# Patient Record
Sex: Male | Born: 1985 | Race: White | Hispanic: Yes | Marital: Married | Smoking: Former smoker
Health system: Southern US, Community
[De-identification: ages and names within clinical notes are randomized; demographics above are authoritative.]

## PROBLEM LIST (undated history)

## (undated) DIAGNOSIS — K219 Gastro-esophageal reflux disease without esophagitis: Secondary | ICD-10-CM

## (undated) DIAGNOSIS — L719 Rosacea, unspecified: Secondary | ICD-10-CM

## (undated) DIAGNOSIS — K589 Irritable bowel syndrome without diarrhea: Secondary | ICD-10-CM

## (undated) DIAGNOSIS — E669 Obesity, unspecified: Secondary | ICD-10-CM

## (undated) DIAGNOSIS — K760 Fatty (change of) liver, not elsewhere classified: Secondary | ICD-10-CM

## (undated) HISTORY — DX: Rosacea, unspecified: L71.9

## (undated) HISTORY — DX: Irritable bowel syndrome, unspecified: K58.9

## (undated) HISTORY — DX: Gastro-esophageal reflux disease without esophagitis: K21.9

## (undated) HISTORY — DX: Fatty (change of) liver, not elsewhere classified: K76.0

## (undated) HISTORY — DX: Obesity, unspecified: E66.9

---

## 2014-11-29 ENCOUNTER — Ambulatory Visit (INDEPENDENT_AMBULATORY_CARE_PROVIDER_SITE_OTHER): Payer: PRIVATE HEALTH INSURANCE | Admitting: Family Medicine

## 2014-11-29 ENCOUNTER — Encounter: Payer: Self-pay | Admitting: Family Medicine

## 2014-11-29 VITALS — BP 151/100 | HR 99 | Temp 98.3°F | Ht 70.0 in | Wt 248.0 lb

## 2014-11-29 DIAGNOSIS — Z0000 Encounter for general adult medical examination without abnormal findings: Secondary | ICD-10-CM

## 2014-11-29 DIAGNOSIS — Z309 Encounter for contraceptive management, unspecified: Secondary | ICD-10-CM

## 2014-11-29 DIAGNOSIS — K219 Gastro-esophageal reflux disease without esophagitis: Secondary | ICD-10-CM | POA: Insufficient documentation

## 2014-11-29 DIAGNOSIS — I10 Essential (primary) hypertension: Secondary | ICD-10-CM

## 2014-11-29 DIAGNOSIS — Z3009 Encounter for other general counseling and advice on contraception: Secondary | ICD-10-CM

## 2014-11-29 MED ORDER — OMEPRAZOLE 40 MG PO CPDR: DELAYED_RELEASE_CAPSULE | ORAL | Status: DC

## 2014-11-29 NOTE — Patient Instructions (Signed)
Dr. Lajoyce Lauber General Advice Following Your Complete Physical Exam  The Benefits of Regular Exercise: Unless you suffer from an uncontrolled cardiovascular condition, studies strongly suggest that regular exercise and physical activity will add to both the quality and length of your life.  The World Health Organization recommends 150 minutes of moderate intensity aerobic activity every week.  This is best split over 3-4 days a week, and can be as simple as a brisk walk for just over 35 minutes "most days of the week".  This type of exercise has been shown to lower LDL-Cholesterol, lower average blood sugars, lower blood pressure, lower cardiovascular disease risk, improve memory, and increase one's overall sense of wellbeing.  The addition of anaerobic (or "strength training") exercises offers additional benefits including but not limited to increased metabolism, prevention of osteoporosis, and improved overall cholesterol levels.  How Can I Strive For A Low-Fat Diet?: Current guidelines recommend that 25-35 percent of your daily energy (food) intake should come from fats.  One might ask how can this be achieved without having to dissect each meal on a daily basis?  Switch to skim or 1% milk instead of whole milk.  Focus on lean meats such as ground [place], fresh fish, baked chicken, and lean cuts of beef as your source of dietary protein.  Limit saturated fat consumption to less than 10% of your daily caloric intake.  Limit trans fatty acid consumption primarily by limiting synthetic trans fats such as partially hydrogenated oils (Ex: fried fast foods).  Substitute olive or vegetable oil for solid fats where possible.  Moderation of Salt Intake: Provided you don't carry a diagnosis of congestive heart failure nor renal failure, I recommend a daily allowance of no more than 2000 mg of salt (sodium).  Keeping under this daily goal is associated with a decreased risk of cardiovascular events, creeping  above it can lead to elevated blood pressures and increases your risk of cardiovascular events.  Milligrams (mg) of salt is listed on all nutrition labels, and your daily intake can add up faster than you think.  Most canned and frozen dinners can pack in over half your daily salt allowance in one meal.    Lifestyle Health Risks: Certain lifestyle choices carry specific health risks.  As you may already know, tobacco use has been associated with increasing one's risk of cardiovascular disease, pulmonary disease, numerous cancers, among many other issues.  What you may not know is that there are medications and nicotine replacement strategies that can more than double your chances of successfully quitting.  I would be thrilled to help manage your quitting strategy if you currently use tobacco products.  When it comes to alcohol use, I've yet to find an "ideal" daily allowance.  Provided an individual does not have a medical condition that is exacerbated by alcohol consumption, general guidelines determine "safe drinking" as no more than two standard drinks for a man or no more than one standard drink for a male per day.  However, much debate still exists on whether any amount of alcohol consumption is technically "safe".  My general advice, keep alcohol consumption to a minimum for general health promotion.  If you or others believe that alcohol, tobacco, or recreational drug use is interfering with your life, I would be happy to provide confidential counseling regarding treatment options.  General "Over The Counter" Nutrition Advice: Postmenopausal women should aim for a daily calcium intake of 1200 mg, however a significant portion of this might already be  provided by diets including milk, yogurt, cheese, and other dairy products.  Vitamin D has been shown to help preserve bone density, prevent fatigue, and has even been shown to help reduce falls in the elderly.  Ensuring a daily intake of 800 Units of  Vitamin D is a good place to start to enjoy the above benefits, we can easily check your Vitamin D level to see if you'd potentially benefit from supplementation beyond 800 Units a day.  Folic Acid intake should be of particular concern to women of childbearing age.  Daily consumption of 400-800 mcg of Folic Acid is recommended to minimize the chance of spinal cord defects in a fetus should pregnancy occur.    For many adults, accidents still remain one of the most common culprits when it comes to cause of death.  Some of the simplest but most effective preventitive habits you can adopt include regular seatbelt use, proper helmet use, securing firearms, and regularly testing your smoke and carbon monoxide detectors.  Bradley Diaz B. Bradley Sevillano DO [facility] [address], [address] [place], [place] [address] Phone: [phone]  

## 2014-11-29 NOTE — Progress Notes (Signed)
CC: Bradley Diaz is a 28 y.o. male is here for Establish Care and URI   Subjective: HPI:  Very pleasant 28 year old here to establish care requesting complete physical exam  Rare alcohol use no tobacco or recreational drug use  He reports a history of epigastric discomfort with burning that radiates up behind the sternum after eating large meals or spicy acidic foods. Symptoms are alleviated he takes 20 mg of omeprazole on a daily basis. Over the past year he has frequently try to take a holiday from this above regimen and has return of symptoms within 1 or 2 days. Currently he denies any symptoms since he's been taking this regularly for at least a month now.  He wants to know if he can be referred to a urologist for vasectomy evaluation.  Review of Systems - General ROS: negative for - chills, fever, night sweats, weight gain or weight loss Ophthalmic ROS: negative for - decreased vision Psychological ROS: negative for - anxiety or depression ENT ROS: negative for - hearing change, nasal congestion, tinnitus or allergies Hematological and Lymphatic ROS: negative for - bleeding problems, bruising or swollen lymph nodes Breast ROS: negative Respiratory ROS: no cough, shortness of breath, or wheezing Cardiovascular ROS: no chest pain or dyspnea on exertion Gastrointestinal ROS: no current abdominal pain, change in bowel habits, or black or bloody stools Genito-Urinary ROS: negative for - genital discharge, genital ulcers, incontinence or abnormal bleeding from genitals Musculoskeletal ROS: negative for - joint pain or muscle pain Neurological ROS: negative for - headaches or memory loss Dermatological ROS: negative for lumps, mole changes, rash and skin lesion changes  Past Medical History  Diagnosis Date  . Acid reflux     No past surgical history on file. Family History  Problem Relation Age of Onset  . Heart attack Father   . Diabetes Father   . Lupus Mother     History    Social History  . Marital Status: Married    Spouse Name: N/A    Number of Children: N/A  . Years of Education: N/A   Occupational History  . Not on file.   Social History Main Topics  . Smoking status: Former Smoker    Quit date: 12/30/2013  . Smokeless tobacco: Not on file  . Alcohol Use: 0.0 oz/week    0 Not specified per week  . Drug Use: No  . Sexual Activity:    Partners: Female   Other Topics Concern  . Not on file   Social History Narrative  . No narrative on file     Objective: BP 151/100 mmHg  Pulse 99  Temp(Src) 98.3 F (36.8 C) (Oral)  Ht 5\' 10"  (1.778 m)  Wt 248 lb (112.492 kg)  BMI 35.58 kg/m2  General: No Acute Distress HEENT: Atraumatic, normocephalic, conjunctivae normal without scleral icterus.  No nasal discharge, hearing grossly intact, TMs with good landmarks bilaterally with no middle ear abnormalities, posterior pharynx clear without oral lesions. Neck: Supple, trachea midline, no cervical nor supraclavicular adenopathy. Pulmonary: Clear to auscultation bilaterally without wheezing, rhonchi, nor rales. Cardiac: Regular rate and rhythm.  No murmurs, rubs, nor gallops. No peripheral edema.  2+ peripheral pulses bilaterally. Abdomen: Bowel sounds normal.  No masses.  Non-tender without rebound.  Negative Murphy's sign. MSK: Grossly intact, no signs of weakness.  Full strength throughout upper and lower extremities.  Full ROM in upper and lower extremities.  No midline spinal tenderness. Neuro: Gait unremarkable, CN II-XII grossly intact.  C5-C6  Reflex 2/4 Bilaterally, L4 Reflex 2/4 Bilaterally.  Cerebellar function intact. Skin: No rashes. Psych: Alert and oriented to person/place/time.  Thought process normal. No anxiety/depression.  Assessment & Plan: Ruger was seen today for establish care and uri.  Diagnoses and associated orders for this visit:  Annual physical exam - COMPLETE METABOLIC PANEL WITH GFR - TSH - Lipid  panel  Essential hypertension - TSH  Gastroesophageal reflux disease, esophagitis presence not specified - omeprazole (PRILOSEC) 40 MG capsule; One by mouth daily at least one hour before a meal.  Vasectomy evaluation - Ambulatory referral to Urology    Healthy lifestyle interventions including but not limited to regular exercise, a healthy low fat diet, moderation of salt intake, the dangers of tobacco/alcohol/recreational drug use, nutrition supplementation, and accident avoidance were discussed with the patient and a handout was provided for future reference. Essential hypertension: After reviewing his diet sounds like he has a huge room for improvement with reducing sodium artifact found in his processed and fast foods. Discussed reducing sodium intake to less than 2000 mg on a daily basis and returning in one month to determine if he needs to be on antihypertensives. Rule out hyperthyroidism and renal disease. GERD: Controlled on omeprazole, provided with formal prescription  Return in about 4 weeks (around 12/27/2014) for Blood pressure recheck.

## 2014-12-05 ENCOUNTER — Ambulatory Visit: Payer: Self-pay | Admitting: Family Medicine

## 2014-12-13 ENCOUNTER — Telehealth: Payer: Self-pay | Admitting: *Deleted

## 2014-12-13 ENCOUNTER — Telehealth: Payer: Self-pay | Admitting: Family Medicine

## 2014-12-13 DIAGNOSIS — R739 Hyperglycemia, unspecified: Secondary | ICD-10-CM

## 2014-12-13 DIAGNOSIS — R7989 Other specified abnormal findings of blood chemistry: Secondary | ICD-10-CM

## 2014-12-13 DIAGNOSIS — R945 Abnormal results of liver function studies: Secondary | ICD-10-CM

## 2014-12-13 LAB — LIPID PANEL
CHOL/HDL RATIO: 5.6 ratio
Cholesterol: 140 mg/dL (ref 0–200)
HDL: 25 mg/dL — ABNORMAL LOW (ref >39)
LDL Cholesterol: 91 mg/dL (ref 0–99)
TRIGLYCERIDES: 118 mg/dL (ref <150)
VLDL: 24 mg/dL (ref 0–40)

## 2014-12-13 LAB — COMPLETE METABOLIC PANEL WITH GFR
ALK PHOS: 108 U/L (ref 39–117)
ALT: 249 U/L — ABNORMAL HIGH (ref 0–53)
AST: 117 U/L — ABNORMAL HIGH (ref 0–37)
Albumin: 4.6 g/dL (ref 3.5–5.2)
BILIRUBIN TOTAL: 1 mg/dL (ref 0.2–1.2)
BUN: 12 mg/dL (ref 6–23)
CO2: 26 mEq/L (ref 19–32)
Calcium: 9.8 mg/dL (ref 8.4–10.5)
Chloride: 99 mEq/L (ref 96–112)
Creat: 0.86 mg/dL (ref 0.50–1.35)
GFR, Est African American: >89 mL/min
GFR, Est Non African American: >89 mL/min
Glucose, Bld: 123 mg/dL — ABNORMAL HIGH (ref 70–99)
Potassium: 4.6 mEq/L (ref 3.5–5.3)
SODIUM: 136 meq/L (ref 135–145)
TOTAL PROTEIN: 8.2 g/dL (ref 6.0–8.3)

## 2014-12-13 LAB — TSH: TSH: 1.208 u[IU]/mL (ref 0.350–4.500)

## 2014-12-13 NOTE — Telephone Encounter (Signed)
Bradley Diaz wanted me to reenter labs into the system to that he could check something. He has orig order and he will cancel the one I just out in

## 2014-12-13 NOTE — Telephone Encounter (Signed)
Pt notified of results.lab order faxed

## 2014-12-13 NOTE — Telephone Encounter (Signed)
Additionally cholesterol and thyroid function were normal.

## 2014-12-13 NOTE — Telephone Encounter (Signed)
Voicemail left for patient to call.

## 2014-12-13 NOTE — Telephone Encounter (Signed)
Andrea/Jamie, Will you please let patient know that his fasting blood sugar was elevated and I'd recommend he have an A1c checked to measure his three month average blood sugar to look into the possibility of having type 2 diabetes.  His liver enzymes were also significantly elevated representing liver inflammation.  I'd like to rule out viral hepatitis with another blood test and to also look into whether fat deposition in his liver is causing this inflammation by getting an ultrasound of the liver.  Please call me if not notified by the radiology department by next week for scheduling (lab slips in your inbox)

## 2014-12-14 LAB — HEPATITIS PANEL, ACUTE
HCV Ab: NEGATIVE
HEP A IGM: NONREACTIVE
Hep B C IgM: NONREACTIVE
Hepatitis B Surface Ag: NEGATIVE

## 2014-12-14 LAB — HEMOGLOBIN A1C
HEMOGLOBIN A1C: 6.2 % — AB (ref <5.7)
Mean Plasma Glucose: 131 mg/dL — ABNORMAL HIGH (ref <117)

## 2014-12-18 ENCOUNTER — Telehealth: Payer: Self-pay | Admitting: Family Medicine

## 2014-12-18 ENCOUNTER — Encounter: Payer: Self-pay | Admitting: Family Medicine

## 2014-12-18 DIAGNOSIS — E1159 Type 2 diabetes mellitus with other circulatory complications: Secondary | ICD-10-CM | POA: Insufficient documentation

## 2014-12-18 DIAGNOSIS — R7303 Prediabetes: Secondary | ICD-10-CM

## 2014-12-18 NOTE — Telephone Encounter (Signed)
Bradley Diaz, Will you please let patient know that his A1c was in the prediabetic range.  It's not high enough to warrant blood sugar lowering medicaton however we'll want to recheck this every 3 months.  This can be improved with engaging in 30-45 minutes of moderate exercise most days of the week and cutting back on carbohydrates in the diet.  I'll let him know once I get results from his ultrasound of the liver, thankfully his viral hepatitis labs were all normal.

## 2014-12-18 NOTE — Telephone Encounter (Signed)
Pt.notified

## 2014-12-20 ENCOUNTER — Ambulatory Visit (INDEPENDENT_AMBULATORY_CARE_PROVIDER_SITE_OTHER): Payer: PRIVATE HEALTH INSURANCE

## 2014-12-20 ENCOUNTER — Encounter: Payer: Self-pay | Admitting: Family Medicine

## 2014-12-20 ENCOUNTER — Other Ambulatory Visit: Payer: Self-pay | Admitting: Family Medicine

## 2014-12-20 DIAGNOSIS — K760 Fatty (change of) liver, not elsewhere classified: Secondary | ICD-10-CM | POA: Insufficient documentation

## 2014-12-20 DIAGNOSIS — R945 Abnormal results of liver function studies: Principal | ICD-10-CM

## 2014-12-20 DIAGNOSIS — R7989 Other specified abnormal findings of blood chemistry: Secondary | ICD-10-CM

## 2014-12-20 MED ORDER — VITAMIN E 180 MG (400 UNIT) PO CAPS: 400.0000 [IU] | ORAL_CAPSULE | Freq: Two times a day (BID) | ORAL | Status: DC

## 2014-12-27 ENCOUNTER — Ambulatory Visit: Payer: PRIVATE HEALTH INSURANCE | Admitting: Family Medicine

## 2015-01-08 ENCOUNTER — Ambulatory Visit (INDEPENDENT_AMBULATORY_CARE_PROVIDER_SITE_OTHER): Payer: PRIVATE HEALTH INSURANCE | Admitting: Family Medicine

## 2015-01-08 ENCOUNTER — Encounter: Payer: Self-pay | Admitting: Family Medicine

## 2015-01-08 VITALS — BP 123/82 | HR 96 | Ht 70.0 in | Wt 237.0 lb

## 2015-01-08 DIAGNOSIS — R7303 Prediabetes: Secondary | ICD-10-CM

## 2015-01-08 DIAGNOSIS — R945 Abnormal results of liver function studies: Secondary | ICD-10-CM

## 2015-01-08 DIAGNOSIS — R7309 Other abnormal glucose: Secondary | ICD-10-CM

## 2015-01-08 DIAGNOSIS — R7989 Other specified abnormal findings of blood chemistry: Secondary | ICD-10-CM

## 2015-01-08 DIAGNOSIS — I10 Essential (primary) hypertension: Secondary | ICD-10-CM

## 2015-01-08 LAB — COMPLETE METABOLIC PANEL WITH GFR
ALBUMIN: 4 g/dL (ref 3.5–5.2)
ALT: 91 U/L — ABNORMAL HIGH (ref 0–53)
AST: 43 U/L — AB (ref 0–37)
Alkaline Phosphatase: 100 U/L (ref 39–117)
BUN: 14 mg/dL (ref 6–23)
CALCIUM: 9.6 mg/dL (ref 8.4–10.5)
CO2: 26 meq/L (ref 19–32)
CREATININE: 0.92 mg/dL (ref 0.50–1.35)
Chloride: 103 mEq/L (ref 96–112)
Glucose, Bld: 158 mg/dL — ABNORMAL HIGH (ref 70–99)
Potassium: 4.3 mEq/L (ref 3.5–5.3)
SODIUM: 137 meq/L (ref 135–145)
TOTAL PROTEIN: 7.6 g/dL (ref 6.0–8.3)
Total Bilirubin: 0.4 mg/dL (ref 0.2–1.2)

## 2015-01-08 NOTE — Progress Notes (Signed)
CC: Bradley Diaz is a 29 y.o. male is here for Follow-up   Subjective: HPI:  Follow-up essential hypertension: No outside blood pressures report. He's been reducing sodium in his diet now achieving less than 2000 mg on a daily basis. In hindsight he realizes he was taking up to 10,000 mg of sodium on a daily basis with his diet. He denies chest pain shortness of breath orthopnea nor peripheral edema  Follow-up prediabetes: He's been reducing carbohydrates in his diet and has now been striving and achieving less than 2000 total cal on a daily basis. No formal exercise routine. He's been checking his blood sugar every morning and in the last week has noticed all readings less than 100 fasting. No polyuria polyphagia or polydipsia  Follow-up elevated LFTs: He's been taking 400 units of vitamin D on a daily basis. There's been no right upper quadrant pain. Rare alcohol use no acetaminophen use. He's been successfully losing weight with diet modifications.   Review Of Systems Outlined In HPI  Past Medical History  Diagnosis Date  . Acid reflux     No past surgical history on file. Family History  Problem Relation Age of Onset  . Heart attack Father   . Diabetes Father   . Lupus Mother     History   Social History  . Marital Status: Married    Spouse Name: N/A    Number of Children: N/A  . Years of Education: N/A   Occupational History  . Not on file.   Social History Main Topics  . Smoking status: Former Smoker    Quit date: 12/30/2013  . Smokeless tobacco: Not on file  . Alcohol Use: 0.0 oz/week    0 Not specified per week  . Drug Use: No  . Sexual Activity:    Partners: Female   Other Topics Concern  . Not on file   Social History Narrative     Objective: BP 123/82 mmHg  Pulse 96  Ht 5\' 10"  (1.778 m)  Wt 237 lb (107.502 kg)  BMI 34.01 kg/m2  General: Alert and Oriented, No Acute Distress HEENT: Pupils equal, round, reactive to light. Conjunctivae clear.   Moist mucous membranes Lungs: Clear to auscultation bilaterally, no wheezing/ronchi/rales.  Comfortable work of breathing. Good air movement. Cardiac: Regular rate and rhythm. Normal S1/S2.  No murmurs, rubs, nor gallops.   Abdomen: Obese and soft Extremities: No peripheral edema.  Strong peripheral pulses.  Mental Status: No depression, anxiety, nor agitation. Skin: Warm and dry.  Assessment & Plan: Bradley Diaz was seen today for follow-up.  Diagnoses and associated orders for this visit:  Essential hypertension - COMPLETE METABOLIC PANEL WITH GFR  Elevated LFTs - COMPLETE METABOLIC PANEL WITH GFR  Prediabetes - COMPLETE METABOLIC PANEL WITH GFR    Essential hypertension: Improved with sodium reduction, applauded his success Elevated LFTs: Anticipate this is improving with his weight loss, checking LFTs today Prediabetes: Controlled and improved with calorie and carbohydrate reduction, applauded his success  Ultimate follow-up will be based on blood sugar and LFTs today.  Return if symptoms worsen or fail to improve.

## 2015-03-20 ENCOUNTER — Ambulatory Visit: Payer: PRIVATE HEALTH INSURANCE | Admitting: Family Medicine

## 2015-03-26 ENCOUNTER — Encounter: Payer: Self-pay | Admitting: Family Medicine

## 2015-03-26 ENCOUNTER — Ambulatory Visit (INDEPENDENT_AMBULATORY_CARE_PROVIDER_SITE_OTHER): Payer: PRIVATE HEALTH INSURANCE | Admitting: Family Medicine

## 2015-03-26 VITALS — BP 135/85 | HR 98 | Wt 233.0 lb

## 2015-03-26 DIAGNOSIS — R7989 Other specified abnormal findings of blood chemistry: Secondary | ICD-10-CM | POA: Diagnosis not present

## 2015-03-26 DIAGNOSIS — R7309 Other abnormal glucose: Secondary | ICD-10-CM | POA: Diagnosis not present

## 2015-03-26 DIAGNOSIS — R7303 Prediabetes: Secondary | ICD-10-CM

## 2015-03-26 DIAGNOSIS — F419 Anxiety disorder, unspecified: Secondary | ICD-10-CM | POA: Diagnosis not present

## 2015-03-26 DIAGNOSIS — L309 Dermatitis, unspecified: Secondary | ICD-10-CM

## 2015-03-26 DIAGNOSIS — R945 Abnormal results of liver function studies: Principal | ICD-10-CM

## 2015-03-26 LAB — HEPATIC FUNCTION PANEL
ALT: 90 U/L — ABNORMAL HIGH (ref 0–53)
AST: 53 U/L — ABNORMAL HIGH (ref 0–37)
Albumin: 4.4 g/dL (ref 3.5–5.2)
Alkaline Phosphatase: 105 U/L (ref 39–117)
BILIRUBIN INDIRECT: 0.6 mg/dL (ref 0.2–1.2)
Bilirubin, Direct: 0.1 mg/dL (ref 0.0–0.3)
TOTAL PROTEIN: 8.3 g/dL (ref 6.0–8.3)
Total Bilirubin: 0.7 mg/dL (ref 0.2–1.2)

## 2015-03-26 LAB — HEMOGLOBIN A1C
HEMOGLOBIN A1C: 5.9 % — AB (ref <5.7)
Mean Plasma Glucose: 123 mg/dL — ABNORMAL HIGH (ref <117)

## 2015-03-26 MED ORDER — CLONAZEPAM 0.5 MG PO TABS: 0.2500 mg | ORAL_TABLET | Freq: Every day | ORAL | Status: DC | PRN

## 2015-03-26 MED ORDER — DESONIDE 0.05 % EX CREA: TOPICAL_CREAM | Freq: Two times a day (BID) | CUTANEOUS | Status: DC

## 2015-03-26 NOTE — Progress Notes (Signed)
CC: Bradley Diaz is a 29 y.o. male is here for a1c   Subjective: HPI:  Follow-up elevated LFTs: Denies any right upper quadrant pain fevers or nausea since I saw him last.  Follow-up prediabetes: He tells me that he's had no time to exercise since I saw him last. His wife is now working and they have very little time to cook at home so he's been eating out and getting fast food more often. He is worried that his A1c is going to be elevated today but no outside blood sugars to report. No polyuria polyphagia or polydipsia  Complains of a rash on his left cheek that's been present for the last 2 months on a daily basis. He's tried Vaseline but this made the rash more red and inflamed appearing so he stopped using it. No other interventions as of yet. He tells me is painless and does not itch. Occasionally gets flaking. No skin lesions elsewhere. He's never had this before he describes it as mild in severity  Complains of anxiousness and irritability that has been present since I saw him last on an almost daily basis. Seems to be worse at home but also present at work. Overall moderate in severity. He tells me he gets nervousness and irritability about any little thing goes wrong in his day. Things that used to be mildly frustrating now are now interfering with his quality of life. He denies any depression or sleep disturbance nor any other mental disturbance. He's never had this before.  Review Of Systems Outlined In HPI  Past Medical History  Diagnosis Date  . Acid reflux     No past surgical history on file. Family History  Problem Relation Age of Onset  . Heart attack Father   . Diabetes Father   . Lupus Mother     History   Social History  . Marital Status: Married    Spouse Name: N/A  . Number of Children: N/A  . Years of Education: N/A   Occupational History  . Not on file.   Social History Main Topics  . Smoking status: Former Smoker    Quit date: 12/30/2013  . Smokeless  tobacco: Not on file  . Alcohol Use: 0.0 oz/week    0 Standard drinks or equivalent per week  . Drug Use: No  . Sexual Activity:    Partners: Female   Other Topics Concern  . Not on file   Social History Narrative     Objective: BP 135/85 mmHg  Pulse 98  Wt 233 lb (105.688 kg)  General: Alert and Oriented, No Acute Distress HEENT: Pupils equal, round, reactive to light. Conjunctivae clear.  Moist mucous membranes Lungs: Clear to auscultation bilaterally, no wheezing/ronchi/rales.  Comfortable work of breathing. Good air movement. Cardiac: Regular rate and rhythm. Normal S1/S2.  No murmurs, rubs, nor gallops.   Abdomen: Obese and soft Extremities: No peripheral edema.  Strong peripheral pulses.  Mental Status: No depression, anxiety, nor agitation. Skin: Warm and dry. Mild eczematous changes on the left cheek  Assessment & Plan: Bradley Diaz was seen today for a1c.  Diagnoses and all orders for this visit:  Elevated LFTs Orders: -     Hepatic function panel  Prediabetes Orders: -     Cancel: POCT HgB A1C -     Hemoglobin A1c  Eczema Orders: -     desonide (DESOWEN) 0.05 % cream; Apply topically 2 (two) times daily.  Anxiety Orders: -     clonazePAM (KLONOPIN)  0.5 MG tablet; Take 0.5-1 tablets (0.25-0.5 mg total) by mouth daily as needed for anxiety.   Elevated LFTs: Due for hepatic function panel today Prediabetes: Fingerstick A1c was obtained however machine was malfunctioning this morning and this order was canceled instead will obtain venipuncture A1cEczema: Start desonide for 2 weeks Anxiety: He's not interested in starting something on a daily basis such as Effexor therefore will provide as needed clonazepam, discussed avoiding operating any heavy machinery due to the potential of sedation.  Return in about 3 months (around 06/25/2015) for Sugar.

## 2015-06-01 ENCOUNTER — Ambulatory Visit (INDEPENDENT_AMBULATORY_CARE_PROVIDER_SITE_OTHER): Payer: PRIVATE HEALTH INSURANCE | Admitting: Family Medicine

## 2015-06-01 ENCOUNTER — Ambulatory Visit (INDEPENDENT_AMBULATORY_CARE_PROVIDER_SITE_OTHER): Payer: PRIVATE HEALTH INSURANCE

## 2015-06-01 ENCOUNTER — Encounter: Payer: Self-pay | Admitting: Family Medicine

## 2015-06-01 VITALS — BP 130/91 | HR 84 | Wt 236.0 lb

## 2015-06-01 DIAGNOSIS — R7303 Prediabetes: Secondary | ICD-10-CM

## 2015-06-01 DIAGNOSIS — R7309 Other abnormal glucose: Secondary | ICD-10-CM

## 2015-06-01 DIAGNOSIS — I10 Essential (primary) hypertension: Secondary | ICD-10-CM | POA: Diagnosis not present

## 2015-06-01 DIAGNOSIS — L309 Dermatitis, unspecified: Secondary | ICD-10-CM

## 2015-06-01 DIAGNOSIS — M545 Low back pain, unspecified: Secondary | ICD-10-CM

## 2015-06-01 DIAGNOSIS — K219 Gastro-esophageal reflux disease without esophagitis: Secondary | ICD-10-CM

## 2015-06-01 MED ORDER — OMEPRAZOLE 40 MG PO CPDR: DELAYED_RELEASE_CAPSULE | ORAL | Status: DC

## 2015-06-01 MED ORDER — DESONIDE 0.05 % EX CREA: TOPICAL_CREAM | Freq: Two times a day (BID) | CUTANEOUS | Status: DC

## 2015-06-01 NOTE — Progress Notes (Signed)
CC: Bradley Diaz is a 29 y.o. male is here for f/u rash around the eyes   Subjective: HPI:   follow-up essential hypertension: No outside blood pressures to report. Denies chest pain shortness of breath orthopnea nor peripheral edema  Follow-up prediabetes: No outside blood sugars to report. Denies polyuria plication or polydipsia. Trying to eat better than last visit.  Follow-up eczema: After 2 weeks of using desonide the rash on his left cheek had disappeared. Within a few days after stopping it returned. He's done this cycle 2 times now and now for the last week has been using desonide twice a day, rash seems to be gone already.  Complains of midline low back pain that has been present for matter years mild in severity on a daily basis whenever going from a leaning forward to extension position such as when doing the dishes or changing his child's diaper. Pain is nonradiating. 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 He had one these episodes occur over the weekend and has been moderate pain up until early this morning. He tells me this happened a few times a year for the last 2 or 3 years.  Review Of Systems Outlined In HPI  Past Medical History  Diagnosis Date  . Acid reflux     No past surgical history on file. Family History  Problem Relation Age of Onset  . Heart attack Father   . Diabetes Father   . Lupus Mother     History   Social History  . Marital Status: Married    Spouse Name: N/A  . Number of Children: N/A  . Years of Education: N/A   Occupational History  . Not on file.   Social History Main Topics  . Smoking status: Former Smoker    Quit date: 12/30/2013  . Smokeless tobacco: Not on file  . Alcohol Use: 0.0 oz/week    0 Standard drinks or equivalent per week  . Drug Use: No  . Sexual Activity:    Partners: Female   Other Topics Concern  . Not on file    Social History Narrative     Objective: BP 130/91 mmHg  Pulse 84  Wt 236 lb (107.049 kg)  Vital signs reviewed. General: Alert and Oriented, No Acute Distress HEENT: Pupils equal, round, reactive to light. Conjunctivae clear.  External ears unremarkable.  Moist mucous membranes. Lungs: Clear and comfortable work of breathing, speaking in full sentences without accessory muscle use. Cardiac: Regular rate and rhythm.  Neuro: CN II-XII grossly intact, gait normal. Back: No midline spinous process tenderness no paraspinal musculature tenderness, pain is localized in the L4 and L5 region. Extremities: No peripheral edema.  Strong peripheral pulses.  Mental Status: No depression, anxiety, nor agitation. Logical though process. Skin: Warm and dry.  Assessment & Plan: [name] was seen today for f/u rash around the eyes.  Diagnoses and all orders for this visit:  Essential hypertension Orders: -     COMPLETE METABOLIC PANEL WITH GFR  Prediabetes Orders: -     Hemoglobin A1c  Eczema Orders: -     desonide (DESOWEN) 0.05 % cream; Apply topically 2 (two) times daily.  Midline low back pain without sciatica Orders: -     DG Lumbar Spine 2-3 Views; Future  Gastroesophageal reflux disease, esophagitis presence not specified Orders: -     omeprazole (PRILOSEC) 40 MG capsule; One  by mouth daily at least one hour before a meal.   Essential hypertension: Uncontrolled, discussed diet and exercise interventions, large room for improvement with dietary changes Prediabetes: ClinicallyControlled not due for A1c yet he was given lab slips for this to be obtained after July 4 Eczema: Improved, urged to use desonide twice a day for a full month. Midline low back pain: The pain in lumbar films GERD: Requesting refills on omeprazole   Return if symptoms worsen or fail to improve.

## 2015-06-26 ENCOUNTER — Ambulatory Visit: Payer: PRIVATE HEALTH INSURANCE | Admitting: Family Medicine

## 2015-07-16 ENCOUNTER — Ambulatory Visit (INDEPENDENT_AMBULATORY_CARE_PROVIDER_SITE_OTHER): Payer: PRIVATE HEALTH INSURANCE | Admitting: Family Medicine

## 2015-07-16 ENCOUNTER — Encounter: Payer: Self-pay | Admitting: Family Medicine

## 2015-07-16 VITALS — BP 156/99 | HR 90 | Wt 240.0 lb

## 2015-07-16 DIAGNOSIS — H1013 Acute atopic conjunctivitis, bilateral: Secondary | ICD-10-CM

## 2015-07-16 DIAGNOSIS — I10 Essential (primary) hypertension: Secondary | ICD-10-CM | POA: Diagnosis not present

## 2015-07-16 DIAGNOSIS — H1010 Acute atopic conjunctivitis, unspecified eye: Secondary | ICD-10-CM | POA: Insufficient documentation

## 2015-07-16 NOTE — Progress Notes (Signed)
Bradley Diaz is a 29 y.o. male who presents to [facility] today for eye discharge. Patient notes itchy watery eyes bilaterally right worse than left present for the last 2 days. He's tried his son's Polytrim eyedrops that have not helped. He denies any pain or blurry vision fevers chills nausea vomiting or diarrhea.  He notes that he has not been following his diet for hypertension.   Past Medical History  Diagnosis Date  . Acid reflux    No past surgical history on file. History  Substance Use Topics  . Smoking status: Former Smoker    Quit date: 12/30/2013  . Smokeless tobacco: Not on file  . Alcohol Use: 0.0 oz/week    0 Standard drinks or equivalent per week   ROS as above Medications: Current Outpatient Prescriptions  Medication Sig Dispense Refill  . desonide (DESOWEN) 0.05 % cream Apply topically 2 (two) times daily. 30 g 1  . omeprazole (PRILOSEC) 40 MG capsule One by mouth daily at least one hour before a meal. 90 capsule 3  . vitamin E (VITAMIN E) 400 UNIT capsule Take 1 capsule (400 Units total) by mouth 2 (two) times daily.    . clonazePAM (KLONOPIN) 0.5 MG tablet Take 0.5-1 tablets (0.25-0.5 mg total) by mouth daily as needed for anxiety. (Patient not taking: Reported on 07/16/2015) 30 tablet 0   No current facility-administered medications for this visit.   Allergies  Allergen Reactions  . Penicillins     As a child      Exam:  BP 156/99 mmHg  Pulse 90  Wt 240 lb (108.863 kg) Gen: Well NAD HEENT: EOMI,  MMM normal appearing conjunctiva bilaterally. Small amount of watery discharge. PERRL Lungs: Normal work of breathing. CTABL Heart: RRR no MRG Abd: NABS, Soft. Nondistended, Nontender Exts: Brisk capillary refill, warm and well perfused.   No results found for this or any previous visit (from the past 24 hour(s)). No results found.   Please see individual assessment and plan sections.

## 2015-07-16 NOTE — Assessment & Plan Note (Signed)
Not following diet. Follow-up with PCP.

## 2015-07-16 NOTE — Assessment & Plan Note (Signed)
Treat with Zaditor and Zyrtec. Polytrim if not better.

## 2015-07-16 NOTE — Patient Instructions (Signed)
Thank you for coming in today. °Use over-the-counter Zaditor eyedrops (Ketotifen) °Use over-the-counter Zyrtec (cetirizine)  °Use Systane artificial tears as needed ° ° °Allergic Conjunctivitis °The conjunctiva is a thin membrane that covers the visible white part of the eyeball and the underside of the eyelids. This membrane protects and lubricates the eye. The membrane has small blood vessels running through it that can normally be seen. When the conjunctiva becomes inflamed, the condition is called conjunctivitis. In response to the inflammation, the conjunctival blood vessels become swollen. The swelling results in redness in the normally white part of the eye. °The blood vessels of this membrane also react when a person has allergies and is then called allergic conjunctivitis. This condition usually lasts for as long as the allergy persists. Allergic conjunctivitis cannot be passed to another person (non-contagious). The likelihood of bacterial infection is great and the cause is not likely due to allergies if the inflamed eye has: °· A sticky discharge. °· Discharge or sticking together of the lids in the morning. °· Scaling or flaking of the eyelids where the eyelashes come out. °· Red swollen eyelids. °CAUSES  °· Viruses. °· Irritants such as foreign bodies. °· Chemicals. °· General allergic reactions. °· Inflammation or serious diseases in the inside or the outside of the eye or the orbit (the boney cavity in which the eye sits) can cause a "red eye." °SYMPTOMS  °· Eye redness. °· Tearing. °· Itchy eyes. °· Burning feeling in the eyes. °· Clear drainage from the eye. °· Allergic reaction due to pollens or ragweed sensitivity. Seasonal allergic conjunctivitis is frequent in the spring when pollens are in the air and in the fall. °DIAGNOSIS  °This condition, in its many forms, is usually diagnosed based on the history and an ophthalmological exam. It usually involves both eyes. If your eyes react at the same  time every year, allergies may be the cause. While most "red eyes" are due to allergy or an infection, the role of an eye (ophthalmological) exam is important. The exam can rule out serious diseases of the eye or orbit. °TREATMENT  °· Non-antibiotic eye drops, ointments, or medications by mouth may be prescribed if the ophthalmologist is sure the conjunctivitis is due to allergies alone. °· Over-the-counter drops and ointments for allergic symptoms should be used only after other causes of conjunctivitis have been ruled out, or as your caregiver suggests. °Medications by mouth are often prescribed if other allergy-related symptoms are present. If the ophthalmologist is sure that the conjunctivitis is due to allergies alone, treatment is normally limited to drops or ointments to reduce itching and burning. °HOME CARE INSTRUCTIONS  °· Wash hands before and after applying drops or ointments, or touching the inflamed eye(s) or eyelids. °· Do not let the eye dropper tip or ointment tube touch the eyelid when putting medicine in your eye. °· Stop using your soft contact lenses and throw them away. Use a new pair of lenses when recovery is complete. You should run through sterilizing cycles at least three times before use after complete recovery if the old soft contact lenses are to be used. Hard contact lenses should be stopped. They need to be thoroughly sterilized before use after recovery. °· Itching and burning eyes due to allergies is often relieved by using a cool cloth applied to closed eye(s). °SEEK MEDICAL CARE IF:  °· Your problems do not go away after two or three days of treatment. °· Your lids are sticky (especially in the   morning when you wake up) or stick together. °· Discharge develops. Antibiotics may be needed either as drops, ointment, or by mouth. °· You have extreme light sensitivity. °· An oral temperature above 102° F (38.9° C) develops. °· Pain in or around the eye or any other visual symptom  develops. °MAKE SURE YOU:  °· Understand these instructions. °· Will watch your condition. °· Will get help right away if you are not doing well or get worse. °Document Released: 02/28/2003 Document Revised: 03/01/2012 Document Reviewed: 01/24/2008 °[employer]® Patient Information ©2015 [employer]. This information is not intended to replace advice given to you by your health care provider. Make sure you discuss any questions you have with your health care provider. ° °

## 2015-07-17 LAB — COMPLETE METABOLIC PANEL WITH GFR
ALBUMIN: 4.7 g/dL (ref 3.6–5.1)
ALT: 64 U/L — AB (ref 9–46)
AST: 34 U/L (ref 10–40)
Alkaline Phosphatase: 99 U/L (ref 40–115)
BILIRUBIN TOTAL: 0.5 mg/dL (ref 0.2–1.2)
BUN: 12 mg/dL (ref 7–25)
CALCIUM: 9.8 mg/dL (ref 8.6–10.3)
CO2: 27 mmol/L (ref 20–31)
CREATININE: 0.84 mg/dL (ref 0.60–1.35)
Chloride: 99 mmol/L (ref 98–110)
Glucose, Bld: 87 mg/dL (ref 65–99)
Potassium: 4.3 mmol/L (ref 3.5–5.3)
Sodium: 138 mmol/L (ref 135–146)
TOTAL PROTEIN: 8.2 g/dL — AB (ref 6.1–8.1)

## 2015-07-17 LAB — HEMOGLOBIN A1C
Hgb A1c MFr Bld: 6 % — ABNORMAL HIGH (ref <5.7)
MEAN PLASMA GLUCOSE: 126 mg/dL — AB (ref <117)

## 2015-07-18 ENCOUNTER — Encounter: Payer: Self-pay | Admitting: Family Medicine

## 2015-07-18 ENCOUNTER — Ambulatory Visit (INDEPENDENT_AMBULATORY_CARE_PROVIDER_SITE_OTHER): Payer: PRIVATE HEALTH INSURANCE | Admitting: Family Medicine

## 2015-07-18 VITALS — BP 134/87 | HR 102 | Wt 239.0 lb

## 2015-07-18 DIAGNOSIS — L723 Sebaceous cyst: Secondary | ICD-10-CM

## 2015-07-18 DIAGNOSIS — H109 Unspecified conjunctivitis: Secondary | ICD-10-CM | POA: Diagnosis not present

## 2015-07-18 DIAGNOSIS — L219 Seborrheic dermatitis, unspecified: Secondary | ICD-10-CM | POA: Diagnosis not present

## 2015-07-18 MED ORDER — CIPROFLOXACIN HCL 0.3 % OP SOLN: OPHTHALMIC | Status: DC

## 2015-07-18 MED ORDER — KETOCONAZOLE 2 % EX CREA: 1.0000 "application " | TOPICAL_CREAM | Freq: Two times a day (BID) | CUTANEOUS | Status: DC

## 2015-07-18 NOTE — Progress Notes (Signed)
CC: Bradley Diaz is a 29 y.o. male is here for rash on face   Subjective: HPI:   Watery eyes for the past four days. occaasionally matted shut when he wakes up.  Waters all day long, worse when out in the sun.  Tried polytrim for 24 hours, no help.  Came and got a H1 blocker from my partner on Monday without any help.  No other interventions. Never had this before. Overall symptoms are mild in severity.  Denies vision loss, pain or itching.  Denies photophobia.   Complaints of back pain localized between the shoulder blades on the back has been present for matter of years. His only present when somebody presses or applies any force to the back there. No motions seem to reproduce the pain otherwise. Denies any overlying skin changes. Denies any radiation of the pain. She described sharp.  Reports that desonide is doing a good job of suppressing the rash on his left cheek. If he stops using desonide he will return within a few days. He's having to use this on a daily basis now. Without using desonide gets flaky, itchy and red.   Review Of Systems Outlined In HPI  Past Medical History  Diagnosis Date  . Acid reflux     No past surgical history on file. Family History  Problem Relation Age of Onset  . Heart attack Father   . Diabetes Father   . Lupus Mother     History   Social History  . Marital Status: Married    Spouse Name: N/A  . Number of Children: N/A  . Years of Education: N/A   Occupational History  . Not on file.   Social History Main Topics  . Smoking status: Former Smoker    Quit date: 12/30/2013  . Smokeless tobacco: Not on file  . Alcohol Use: 0.0 oz/week    0 Standard drinks or equivalent per week  . Drug Use: No  . Sexual Activity:    Partners: Female   Other Topics Concern  . Not on file   Social History Narrative     Objective: BP 134/87 mmHg  Pulse 102  Wt 239 lb (108.41 kg)  General: Alert and Oriented, No Acute Distress HEENT: Pupils equal,  round, reactive to light. Conjunctivae clear.  No photophobia, mild crusting at the base of all eyelashes. Moist membranes, pharynx unremarkable. Lungs: Clear comfortable work of breathing Cardiac: Regular rate and rhythm.  Extremities: No peripheral edema.  Strong peripheral pulses.  Mental Status: No depression, anxiety, nor agitation. Skin: Warm and dry. Just above the shoulder blades in the middle of the back there is a tender sebaceous cyst without any midline spinous process tenderness.  Assessment & Plan: Bradley Diaz was seen today for rash on face.  Diagnoses and all orders for this visit:  Sebaceous cyst Orders: -     Ambulatory referral to Dermatology  Seborrheic dermatitis Orders: -     ketoconazole (NIZORAL) 2 % cream; Apply 1 application topically 2 (two) times daily. For four weeks, use with desonide.  Bilateral conjunctivitis Orders: -     ciprofloxacin (CILOXAN) 0.3 % ophthalmic solution; Administer 1 drop, every 2 hours, while awake, for 2 days. Then 1 drop, every 4 hours, while awake, for the next 5 days.   Sebaceous cyst: Discussed that this does not necessarily have to be removed however due to the degree of pain he would like to have it removed therefore referral to dermatology Seborrheic dermatitis: Continue desonide  but add ketoconazole for the next 4 weeks and then see if he can stop both of them Bacterial conjunctivae: Stop the histamine blocker, begin ciprofloxacin ophthalmic   Return in about 3 months (around 10/18/2015).

## 2015-07-27 ENCOUNTER — Telehealth: Payer: Self-pay | Admitting: Family Medicine

## 2015-07-27 DIAGNOSIS — D179 Benign lipomatous neoplasm, unspecified: Secondary | ICD-10-CM

## 2015-07-27 NOTE — Telephone Encounter (Signed)
Seth Bake, Will you please let patient know that I got a report from his dermatology appointment with the suggestion that he see a general surgeon to remove his masses, I've placed an order for this.

## 2015-07-30 ENCOUNTER — Ambulatory Visit: Payer: PRIVATE HEALTH INSURANCE | Admitting: Family Medicine

## 2015-07-30 NOTE — Telephone Encounter (Signed)
Pt.notified

## 2015-08-07 ENCOUNTER — Other Ambulatory Visit: Payer: Self-pay

## 2015-08-28 ENCOUNTER — Encounter: Payer: Self-pay | Admitting: Family Medicine

## 2015-08-28 ENCOUNTER — Ambulatory Visit (INDEPENDENT_AMBULATORY_CARE_PROVIDER_SITE_OTHER): Payer: PRIVATE HEALTH INSURANCE | Admitting: Family Medicine

## 2015-08-28 ENCOUNTER — Ambulatory Visit (INDEPENDENT_AMBULATORY_CARE_PROVIDER_SITE_OTHER): Payer: PRIVATE HEALTH INSURANCE

## 2015-08-28 VITALS — BP 144/100 | HR 94 | Wt 247.0 lb

## 2015-08-28 DIAGNOSIS — M546 Pain in thoracic spine: Secondary | ICD-10-CM | POA: Diagnosis not present

## 2015-08-28 MED ORDER — HYDROCODONE-ACETAMINOPHEN 10-325 MG PO TABS: 0.5000 | ORAL_TABLET | Freq: Three times a day (TID) | ORAL | Status: DC | PRN

## 2015-08-28 MED ORDER — KETOROLAC TROMETHAMINE 60 MG/2ML IM SOLN
60.0000 mg | Freq: Once | INTRAMUSCULAR | Status: AC
  Administered 2015-08-28: 60 mg via INTRAMUSCULAR

## 2015-08-28 NOTE — Progress Notes (Signed)
CC: Bradley Diaz is a 29 y.o. male is here for Back Pain   Subjective: HPI:  Left-sided back pain that began on Sunday. He woke up with the pain but did not awaken him from his sleep. He denies any recent remote trauma. Pain feels identical to that which she experienced a few years ago but it was only present for 1 day however this time pain is persistent. Any movement of the torso reproduces pain. He denies any pain with breathing or shortness of breath. Pain is slightly lateral to the midline of the back and near the shoulder blade on the left. Denies cough, wheezing, nor exertional component to his chest pain. Denies any overlying skin changes or radiation of pain. It's described as a stabbing pain . Symptoms are not improving with ibuprofen or tramadol.   Review Of Systems Outlined In HPI  Past Medical History  Diagnosis Date  . Acid reflux     No past surgical history on file. Family History  Problem Relation Age of Onset  . Heart attack Father   . Diabetes Father   . Lupus Mother     Social History   Social History  . Marital Status: Married    Spouse Name: N/A  . Number of Children: N/A  . Years of Education: N/A   Occupational History  . Not on file.   Social History Main Topics  . Smoking status: Former Smoker    Quit date: 12/30/2013  . Smokeless tobacco: Not on file  . Alcohol Use: 0.0 oz/week    0 Standard drinks or equivalent per week  . Drug Use: No  . Sexual Activity:    Partners: Female   Other Topics Concern  . Not on file   Social History Narrative     Objective: BP 144/100 mmHg  Pulse 94  Wt 247 lb (112.038 kg)  General: Alert and Oriented, No Acute Distress HEENT: Pupils equal, round, reactive to light. Conjunctivae clear. Moist mucous membranes  Lungs: Clear to auscultation bilaterally, no wheezing/ronchi/rales.  Comfortable work of breathing. Good air movement. Cardiac: Regular rate and rhythm. Normal S1/S2.  No murmurs, rubs, nor  gallops.   Back: No midline spinous process tenderness in the thoracic region. Pain is reproduced with palpation of the soft tissue just medial to the left scapula. Full range of motion and strength of both upper extremities without reproducing any of his pain.  Extremities: No peripheral edema.  Strong peripheral pulses.  Mental Status: No depression, anxiety, nor agitation. Skin: Warm and dry. No overlying skin changes at the site of discomfort  Assessment & Plan: Captain was seen today for back pain.  Diagnoses and all orders for this visit:  Left-sided thoracic back pain -     DG Thoracic Spine W/Swimmers; Future -     HYDROcodone-acetaminophen (NORCO) 10-325 MG per tablet; Take 0.5-1 tablets by mouth every 8 (eight) hours as needed. -     ketorolac (TORADOL) injection 60 mg; Inject 2 mLs (60 mg total) into the muscle once.   Obtaining plain films given that this is a recurrent problem, rule out orthopedic abnormality. Toradol given today to help with pain, may start Norco as needed until plain films and further plans are available.  Return if symptoms worsen or fail to improve.

## 2015-10-30 ENCOUNTER — Ambulatory Visit (INDEPENDENT_AMBULATORY_CARE_PROVIDER_SITE_OTHER): Payer: PRIVATE HEALTH INSURANCE | Admitting: Family Medicine

## 2015-10-30 ENCOUNTER — Encounter: Payer: Self-pay | Admitting: Family Medicine

## 2015-10-30 VITALS — BP 141/87 | HR 94 | Wt 245.0 lb

## 2015-10-30 DIAGNOSIS — R197 Diarrhea, unspecified: Secondary | ICD-10-CM

## 2015-10-30 MED ORDER — DIPHENOXYLATE-ATROPINE 2.5-0.025 MG PO TABS: 1.0000 | ORAL_TABLET | Freq: Four times a day (QID) | ORAL | Status: DC | PRN

## 2015-10-30 NOTE — Progress Notes (Signed)
CC: Bradley Diaz is a 29 y.o. male is here for Diarrhea   Subjective: HPI:  2 weeks of loose stool ranging from 3-5 bowel movements a day. He describes it as loose occasionally watery. He's been following a BRAT diet however does not seem to be helping. He tells me nothing seems to make symptoms better or worse. Moderate in severity. He denies any abdominal pain, flank pain, blood in stool, urinary complaints, nor back pain. Denies nausea or decreased appetite. He denies waking up from symptoms in the middle the night.     Review Of Systems Outlined In HPI  Past Medical History  Diagnosis Date  . Acid reflux     No past surgical history on file. Family History  Problem Relation Age of Onset  . Heart attack Father   . Diabetes Father   . Lupus Mother     Social History   Social History  . Marital Status: Married    Spouse Name: N/A  . Number of Children: N/A  . Years of Education: N/A   Occupational History  . Not on file.   Social History Main Topics  . Smoking status: Former Smoker    Quit date: 12/30/2013  . Smokeless tobacco: Not on file  . Alcohol Use: 0.0 oz/week    0 Standard drinks or equivalent per week  . Drug Use: No  . Sexual Activity:    Partners: Female   Other Topics Concern  . Not on file   Social History Narrative     Objective: BP 141/87 mmHg  Pulse 94  Wt 245 lb (111.131 kg)  General: Alert and Oriented, No Acute Distress HEENT: Pupils equal, round, reactive to light. Conjunctivae clear. moist Mucous membranes Lungs: Clear to auscultation bilaterally, no wheezing/ronchi/rales.  Comfortable work of breathing. Good air movement. Cardiac: Regular rate and rhythm. Normal S1/S2.  No murmurs, rubs, nor gallops.   Abdomen: Normal bowel sounds, soft and non tender without palpable masses. No rebound tenderness or guarding Extremities: No peripheral edema.  Strong peripheral pulses.  Mental Status: No depression, anxiety, nor agitation. Skin:  Warm and dry.  Assessment & Plan: Bradley Diaz was seen today for diarrhea.  Diagnoses and all orders for this visit:  Diarrhea, unspecified type -     diphenoxylate-atropine (LOMOTIL) 2.5-0.025 MG tablet; Take 1 tablet by mouth 4 (four) times daily as needed for diarrhea or loose stools.   Diarrhea: Low suspicion for bacterial infection given lack of pain and fever. Start Lomotil call if no better by Friday and I can provide fiber recommendations. Start probiotics.  Return if symptoms worsen or fail to improve.

## 2015-11-14 ENCOUNTER — Telehealth: Payer: Self-pay

## 2015-11-14 DIAGNOSIS — R197 Diarrhea, unspecified: Secondary | ICD-10-CM

## 2015-11-14 MED ORDER — ELUXADOLINE 100 MG PO TABS: 100.0000 mg | ORAL_TABLET | Freq: Two times a day (BID) | ORAL | Status: DC

## 2015-11-14 NOTE — Telephone Encounter (Signed)
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  If this isn't helping the next step would be a GI referral, if he'd prefer to just directly go to a GI doc referral I'm ok with that as well.

## 2015-11-14 NOTE — Telephone Encounter (Signed)
Pt advised.  Pt would like to have that GI referral place.

## 2015-11-14 NOTE — Telephone Encounter (Signed)
Pt called reporting that he is only better when taking lomotil.  If he skips a day/dose he begins to have diarrhea.  Pt doesn't want to treat this medication like its a maintenance med. Do you have any suggestion?

## 2015-11-19 ENCOUNTER — Telehealth: Payer: Self-pay

## 2015-11-19 NOTE — Telephone Encounter (Signed)
Pt advised.  Pt reports that he began taking a probiotic his Sx has ceased.

## 2015-11-19 NOTE — Telephone Encounter (Signed)
Yes, lomotil on a PRN basis and Viberzi twice a day in hopes of preventing symptoms.

## 2015-11-19 NOTE — Telephone Encounter (Signed)
Should pt take both lomotil & viberzi?

## 2015-11-22 ENCOUNTER — Encounter: Payer: Self-pay | Admitting: Internal Medicine

## 2015-11-22 ENCOUNTER — Ambulatory Visit (INDEPENDENT_AMBULATORY_CARE_PROVIDER_SITE_OTHER): Payer: PRIVATE HEALTH INSURANCE | Admitting: Internal Medicine

## 2015-11-22 VITALS — BP 110/80 | HR 80 | Ht 70.0 in | Wt 245.0 lb

## 2015-11-22 DIAGNOSIS — K529 Noninfective gastroenteritis and colitis, unspecified: Secondary | ICD-10-CM | POA: Diagnosis not present

## 2015-11-22 NOTE — Progress Notes (Signed)
  Referred by Dr. Marcial Pacas Subjective:    Patient ID: Bradley Diaz, male    DOB: [birth year], 29 y.o.   MRN: [id number] Cc: Diarrhea HPI This 29 year old married white man has had about a 4 to six-week history of onset of borborygmi and diarrhea. He thought it might have been spicy foods, he modified his diet he was on a brat diet. Tried Pepto-Bismol and other over-the-counter antidiarrheals without significant relief. He saw primary care and was prescribed Lomotil which helped but would wear off. He has started on align each day and is noted a significant improvement is essentially back to normal. Prior to this starting he did have a course of doxycycline as part of a treatment course for rosacea. All other GI review of systems are negative at this time except for occasional heartburn.  Medications, allergies, past medical history, past surgical history, family history and social history are reviewed and updated in the EMR.   Review of Systems Recent problems with rosacea which cleared, occasional back pain daily fatigue. Increased urination noted recently. He is under some stress but would not elaborate.    Objective:   Physical Exam @BP  110/80 mmHg  Pulse 80  Ht 5\' 10"  (1.778 m)  Wt 245 lb (111.131 kg)  BMI 35.15 kg/m2@  General:  Well-developed, well-nourished and in no acute distress obese Eyes:  anicteric. ENT:   Mouth and posterior pharynx free of lesions.  Neck:   supple w/o thyromegaly or mass.  Lungs: Clear to auscultation bilaterally. Heart:  S1S2, no rubs, murmurs, gallops. Abdomen: obese oft, non-tender, no hepatosplenomegaly, hernia, or mass and BS+. + striae Lymph:  no cervical or supraclavicular adenopathy. Extremities:   no edema, cyanosis or clubbing Skin   no rash. Neuro:  A&O x 3.  Psych:  appropriate mood and  Affect.   Data Reviewed: PCP notes     Assessment & Plan:  Chronic diarrhea  seems to be resolved or resolving.  He is improving after 1 week  of Align. I do not think we can say this is IBS ? Gut flora disruption from doxycycline taken for rosacea  He will take Align x 2 months - stop - if recurrent sxs likely colonoscopy The risks and benefits as well as alternatives of endoscopic procedure(s) have been discussed and reviewed. All questions answered. The patient agrees to proceed.  I appreciate the opportunity to care for this patient. CC: Marcial Pacas, DO

## 2015-11-22 NOTE — Patient Instructions (Signed)
   Take your Align for a total of 2 months, then stop it.  If symptoms return then call us back.    I appreciate the opportunity to care for you. Silvano Rusk, MD, [facility]

## 2015-11-23 ENCOUNTER — Encounter: Payer: Self-pay | Admitting: Internal Medicine

## 2016-02-12 ENCOUNTER — Ambulatory Visit (INDEPENDENT_AMBULATORY_CARE_PROVIDER_SITE_OTHER): Payer: PRIVATE HEALTH INSURANCE | Admitting: Family Medicine

## 2016-02-12 ENCOUNTER — Encounter: Payer: Self-pay | Admitting: Family Medicine

## 2016-02-12 DIAGNOSIS — Z23 Encounter for immunization: Secondary | ICD-10-CM | POA: Diagnosis not present

## 2016-02-12 NOTE — Progress Notes (Signed)
Flu Shot

## 2016-05-16 ENCOUNTER — Encounter: Payer: Self-pay | Admitting: Family Medicine

## 2016-05-16 ENCOUNTER — Ambulatory Visit (INDEPENDENT_AMBULATORY_CARE_PROVIDER_SITE_OTHER): Payer: PRIVATE HEALTH INSURANCE | Admitting: Family Medicine

## 2016-05-16 VITALS — BP 123/83 | HR 73 | Wt 203.0 lb

## 2016-05-16 DIAGNOSIS — R7303 Prediabetes: Secondary | ICD-10-CM | POA: Diagnosis not present

## 2016-05-16 DIAGNOSIS — K219 Gastro-esophageal reflux disease without esophagitis: Secondary | ICD-10-CM | POA: Diagnosis not present

## 2016-05-16 LAB — POCT GLYCOSYLATED HEMOGLOBIN (HGB A1C): HEMOGLOBIN A1C: 5.5

## 2016-05-16 MED ORDER — OMEPRAZOLE 40 MG PO CPDR: DELAYED_RELEASE_CAPSULE | ORAL | Status: DC

## 2016-05-16 NOTE — Progress Notes (Signed)
CC: Bradley Diaz is a 30 y.o. male is here for check A1c   Subjective: HPI:  Follow-up prediabetes: He's lost him was 40 pounds since I saw him last totally intentionally after reducing portions, reducing meat intake and focusing on a plant-based diet. No outside blood sugars to report. He tells me he feels great. He denies polyuria or polyphagia or polydipsia.  Follow GERD: He is requesting a refill on omeprazole. Since he started taking this on a daily basis about a year ago he's not had any discomfort in the epigastric region or any reflux symptoms. He's never tried stopping this medication. He denies any gastrointestinal complaints today   Review Of Systems Outlined In HPI  Past Medical History  Diagnosis Date  . Acid reflux   . IBS (irritable bowel syndrome)     ? per pcp  . Obesity   . Fatty liver   . Rosacea     No past surgical history on file. Family History  Problem Relation Age of Onset  . Heart attack Father   . Diabetes Father   . Lupus Mother     Social History   Social History  . Marital Status: Married    Spouse Name: N/A  . Number of Children: N/A  . Years of Education: N/A   Occupational History  . Not on file.   Social History Main Topics  . Smoking status: Former Smoker    Quit date: 12/30/2013  . Smokeless tobacco: Not on file  . Alcohol Use: 0.0 oz/week    0 Standard drinks or equivalent per week     Comment: occasionally  . Drug Use: No  . Sexual Activity:    Partners: Female   Other Topics Concern  . Not on file   Social History Narrative     Objective: BP 123/83 mmHg  Pulse 73  Wt 203 lb (92.08 kg)  General: Alert and Oriented, No Acute Distress HEENT: Pupils equal, round, reactive to light. Conjunctivae clear. Moist mucous membranes Lungs: Clear to auscultation bilaterally, no wheezing/ronchi/rales.  Comfortable work of breathing. Good air movement. Cardiac: Regular rate and rhythm. Normal S1/S2.  No murmurs, rubs, nor  gallops.   Extremities: No peripheral edema.  Strong peripheral pulses.  Mental Status: No depression, anxiety, nor agitation. Skin: Warm and dry.  Assessment & Plan: Bradley Diaz was seen today for check a1c.  Diagnoses and all orders for this visit:  Prediabetes -     POCT HgB A1C  Gastroesophageal reflux disease, esophagitis presence not specified -     omeprazole (PRILOSEC) 40 MG capsule; One by mouth daily at least one hour before a meal.   Prediabetes: A1c of 5.5, control, congratulated his success with weight loss. GERD: Controlled with omeprazole. Refills were provided, I have also encouraged him to experiment with possibly stopping this medication given his substantial weight loss.   Return in about 6 months (around 11/16/2016) for a1c.

## 2016-05-21 ENCOUNTER — Encounter: Payer: Self-pay | Admitting: Family Medicine

## 2016-05-21 ENCOUNTER — Ambulatory Visit (INDEPENDENT_AMBULATORY_CARE_PROVIDER_SITE_OTHER): Payer: PRIVATE HEALTH INSURANCE | Admitting: Family Medicine

## 2016-05-21 VITALS — BP 124/83 | HR 70 | Wt 206.0 lb

## 2016-05-21 DIAGNOSIS — Z0000 Encounter for general adult medical examination without abnormal findings: Secondary | ICD-10-CM

## 2016-05-21 LAB — LIPID PANEL
CHOL/HDL RATIO: 4.5 ratio (ref <=5.0)
CHOLESTEROL: 149 mg/dL (ref 125–200)
HDL: 33 mg/dL — AB (ref >=40)
LDL CALC: 89 mg/dL (ref <130)
TRIGLYCERIDES: 136 mg/dL (ref <150)
VLDL: 27 mg/dL (ref <30)

## 2016-05-21 LAB — COMPLETE METABOLIC PANEL WITH GFR
ALT: 36 U/L (ref 9–46)
AST: 26 U/L (ref 10–40)
Albumin: 4.4 g/dL (ref 3.6–5.1)
Alkaline Phosphatase: 97 U/L (ref 40–115)
BILIRUBIN TOTAL: 0.5 mg/dL (ref 0.2–1.2)
BUN: 9 mg/dL (ref 7–25)
CO2: 27 mmol/L (ref 20–31)
Calcium: 9.8 mg/dL (ref 8.6–10.3)
Chloride: 103 mmol/L (ref 98–110)
Creat: 0.91 mg/dL (ref 0.60–1.35)
GLUCOSE: 93 mg/dL (ref 65–99)
POTASSIUM: 4.8 mmol/L (ref 3.5–5.3)
SODIUM: 140 mmol/L (ref 135–146)
Total Protein: 7.6 g/dL (ref 6.1–8.1)

## 2016-05-21 LAB — CBC
HCT: 45.7 % (ref 38.5–50.0)
Hemoglobin: 15.7 g/dL (ref 13.2–17.1)
MCH: 33.3 pg — AB (ref 27.0–33.0)
MCHC: 34.4 g/dL (ref 32.0–36.0)
MCV: 97 fL (ref 80.0–100.0)
MPV: 11.6 fL (ref 7.5–12.5)
PLATELETS: 167 10*3/uL (ref 140–400)
RBC: 4.71 MIL/uL (ref 4.20–5.80)
RDW: 12.6 % (ref 11.0–15.0)
WBC: 4.7 10*3/uL (ref 3.8–10.8)

## 2016-05-21 NOTE — Progress Notes (Signed)
CC: Bradley Diaz is a 30 y.o. male is here for Annual Exam   Subjective: HPI:  Colonoscopy: No current indication for screening Prostate: Discussed screening risks/beneifts with patient today, no current indication for prostate cancer screening  Influenza Vaccine: No current indication Pneumovax: No current indication Td/Tdap: utd Zoster: (Start 31 yo)  Requesting complete physical exam was only complaint being pimples in the suprapubic region.  Review of Systems - General ROS: negative for - chills, fever, night sweats, weight gain or weight loss Ophthalmic ROS: negative for - decreased vision Psychological ROS: negative for - anxiety or depression ENT ROS: negative for - hearing change, nasal congestion, tinnitus or allergies Hematological and Lymphatic ROS: negative for - bleeding problems, bruising or swollen lymph nodes Breast ROS: negative Respiratory ROS: no cough, shortness of breath, or wheezing Cardiovascular ROS: no chest pain or dyspnea on exertion Gastrointestinal ROS: no abdominal pain, change in bowel habits, or black or bloody stools Genito-Urinary ROS: negative for - genital discharge, genital ulcers, incontinence or abnormal bleeding from genitals Musculoskeletal ROS: negative for - joint pain or muscle pain Neurological ROS: negative for - headaches or memory loss Dermatological ROS: negative for lumps, mole changes, rash and skin lesion changes other than that described above  Past Medical History  Diagnosis Date  . Acid reflux   . IBS (irritable bowel syndrome)     ? per pcp  . Obesity   . Fatty liver   . Rosacea     No past surgical history on file. Family History  Problem Relation Age of Onset  . Heart attack Father   . Diabetes Father   . Lupus Mother     Social History   Social History  . Marital Status: Married    Spouse Name: N/A  . Number of Children: N/A  . Years of Education: N/A   Occupational History  . Not on file.   Social  History Main Topics  . Smoking status: Former Smoker    Quit date: 12/30/2013  . Smokeless tobacco: Not on file  . Alcohol Use: 0.0 oz/week    0 Standard drinks or equivalent per week     Comment: occasionally  . Drug Use: No  . Sexual Activity:    Partners: Female   Other Topics Concern  . Not on file   Social History Narrative     Objective: BP 124/83 mmHg  Pulse 70  Wt 206 lb (93.441 kg)  General: No Acute Distress HEENT: Atraumatic, normocephalic, conjunctivae normal without scleral icterus.  No nasal discharge, hearing grossly intact, TMs with good landmarks bilaterally with no middle ear abnormalities, posterior pharynx clear without oral lesions. Neck: Supple, trachea midline, no cervical nor supraclavicular adenopathy. Pulmonary: Clear to auscultation bilaterally without wheezing, rhonchi, nor rales. Cardiac: Regular rate and rhythm.  No murmurs, rubs, nor gallops. No peripheral edema.  2+ peripheral pulses bilaterally. Abdomen: Bowel sounds normal.  No masses.  Non-tender without rebound.  Negative Murphy's sign. MSK: Grossly intact, no signs of weakness.  Full strength throughout upper and lower extremities.  Full ROM in upper and lower extremities.  No midline spinal tenderness. Neuro: Gait unremarkable, CN II-XII grossly intact.  C5-C6 Reflex 2/4 Bilaterally, L4 Reflex 2/4 Bilaterally.  Cerebellar function intact. Skin: No rashes. Trace acne in the  suprapubic region Psych: Alert and oriented to person/place/time.  Thought process normal. No anxiety/depression.  Assessment & Plan: Bradley Diaz was seen today for annual exam.  Diagnoses and all orders for this visit:  Annual  physical exam -     Lipid panel -     CBC -     COMPLETE METABOLIC PANEL WITH GFR  Healthy lifestyle interventions including but not limited to regular exercise, a healthy low fat diet, moderation of salt intake, the dangers of tobacco/alcohol/recreational drug use, nutrition supplementation, and  accident avoidance were discussed with the patient and a handout was provided for future reference.  Healthy lifestyle interventions including but not limited to regular exercise, a healthy low fat diet, moderation of salt intake, the dangers of tobacco/alcohol/recreational drug use, nutrition supplementation, and accident avoidance were discussed with the patient and a handout was provided for future reference. Consider salicylic acid washes for the suprapubic region  Return if symptoms worsen or fail to improve.

## 2016-09-25 ENCOUNTER — Ambulatory Visit (INDEPENDENT_AMBULATORY_CARE_PROVIDER_SITE_OTHER): Payer: PRIVATE HEALTH INSURANCE | Admitting: Family Medicine

## 2016-09-25 ENCOUNTER — Encounter: Payer: Self-pay | Admitting: Family Medicine

## 2016-09-25 VITALS — BP 137/93 | HR 71 | Temp 98.2°F | Wt 227.0 lb

## 2016-09-25 DIAGNOSIS — Z114 Encounter for screening for human immunodeficiency virus [HIV]: Secondary | ICD-10-CM

## 2016-09-25 DIAGNOSIS — Z113 Encounter for screening for infections with a predominantly sexual mode of transmission: Secondary | ICD-10-CM

## 2016-09-25 NOTE — Patient Instructions (Signed)
Thank you for coming in today. We will let you know the results ASAP.

## 2016-09-25 NOTE — Progress Notes (Signed)
       Bradley Diaz is a 30 y.o. male who presents to [facility] today for STD screening. Patient is asymptomatic and would like routine testing for STDs. He feels well otherwise no fevers or chills vomiting or diarrhea.   Past Medical History:  Diagnosis Date  . Acid reflux   . Fatty liver   . IBS (irritable bowel syndrome)    ? per pcp  . Obesity   . Rosacea    No past surgical history on file. Social History  Substance Use Topics  . Smoking status: Former Smoker    Quit date: 12/30/2013  . Smokeless tobacco: Not on file  . Alcohol use 0.0 oz/week     Comment: occasionally   family history includes Diabetes in his father; Heart attack in his father; Lupus in his mother.  ROS as above:  Medications: Current Outpatient Prescriptions  Medication Sig Dispense Refill  . omeprazole (PRILOSEC) 40 MG capsule One by mouth daily at least one hour before a meal. 90 capsule 3   No current facility-administered medications for this visit.    Allergies  Allergen Reactions  . Penicillins     As a child      Exam:  BP (!) 137/93   Pulse 71   Temp 98.2 F (36.8 C) (Oral)   Wt 227 lb (103 kg)   BMI 32.57 kg/m  Gen: Well NAD Genitals: No inguinal lymphadenopathy. Testicles are descended bilaterally nontender without masses. The penis is circumcised with no lesions or discharge present.  No results found for this or any previous visit (from the past 24 hour(s)). No results found.    Assessment and Plan: 30 y.o. male with screening for STD. GC chlamydia HIV and RPR pending.   Orders Placed This Encounter  Procedures  . GC/Chlamydia Probe Amp  . HIV antibody  . RPR    Discussed warning signs or symptoms. Please see discharge instructions. Patient expresses understanding.

## 2016-09-26 LAB — RPR

## 2016-09-26 LAB — HIV ANTIBODY (ROUTINE TESTING W REFLEX): HIV: NONREACTIVE

## 2016-09-26 LAB — GC/CHLAMYDIA PROBE AMP
CT Probe RNA: NOT DETECTED
GC Probe RNA: NOT DETECTED

## 2016-11-26 IMAGING — CR DG LUMBAR SPINE 2-3V
3 series · 3 of 3 positions shown · non-contrast
Comparison: None.

CLINICAL DATA: Two years of low back pain without radicular
symptoms

EXAM:
LUMBAR SPINE - 2-3 VIEW

[l-spine ap]
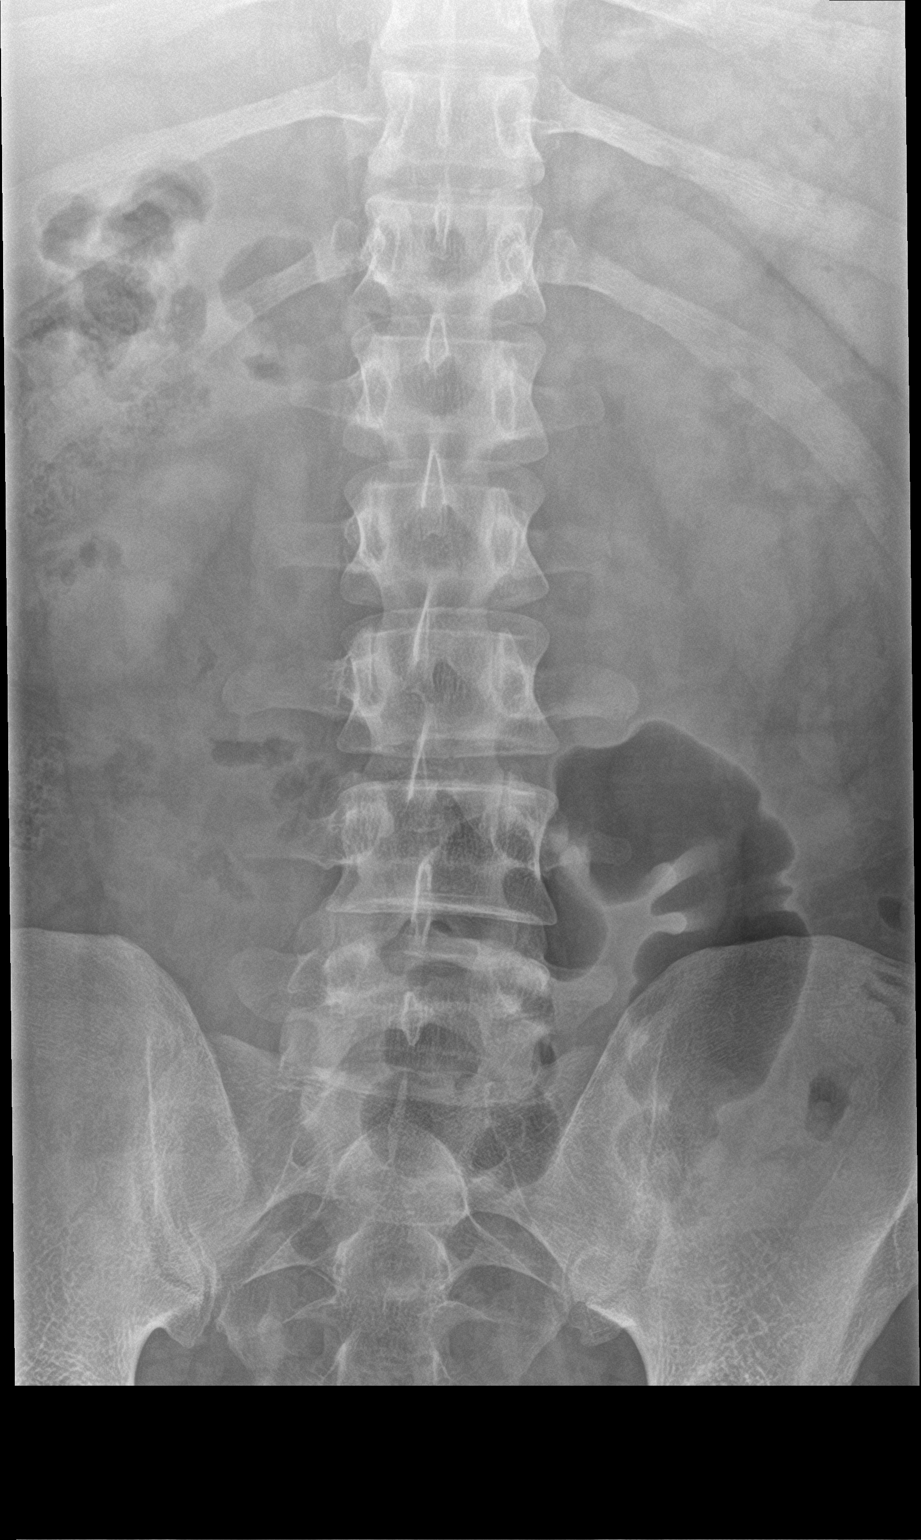

[l-spine lat]
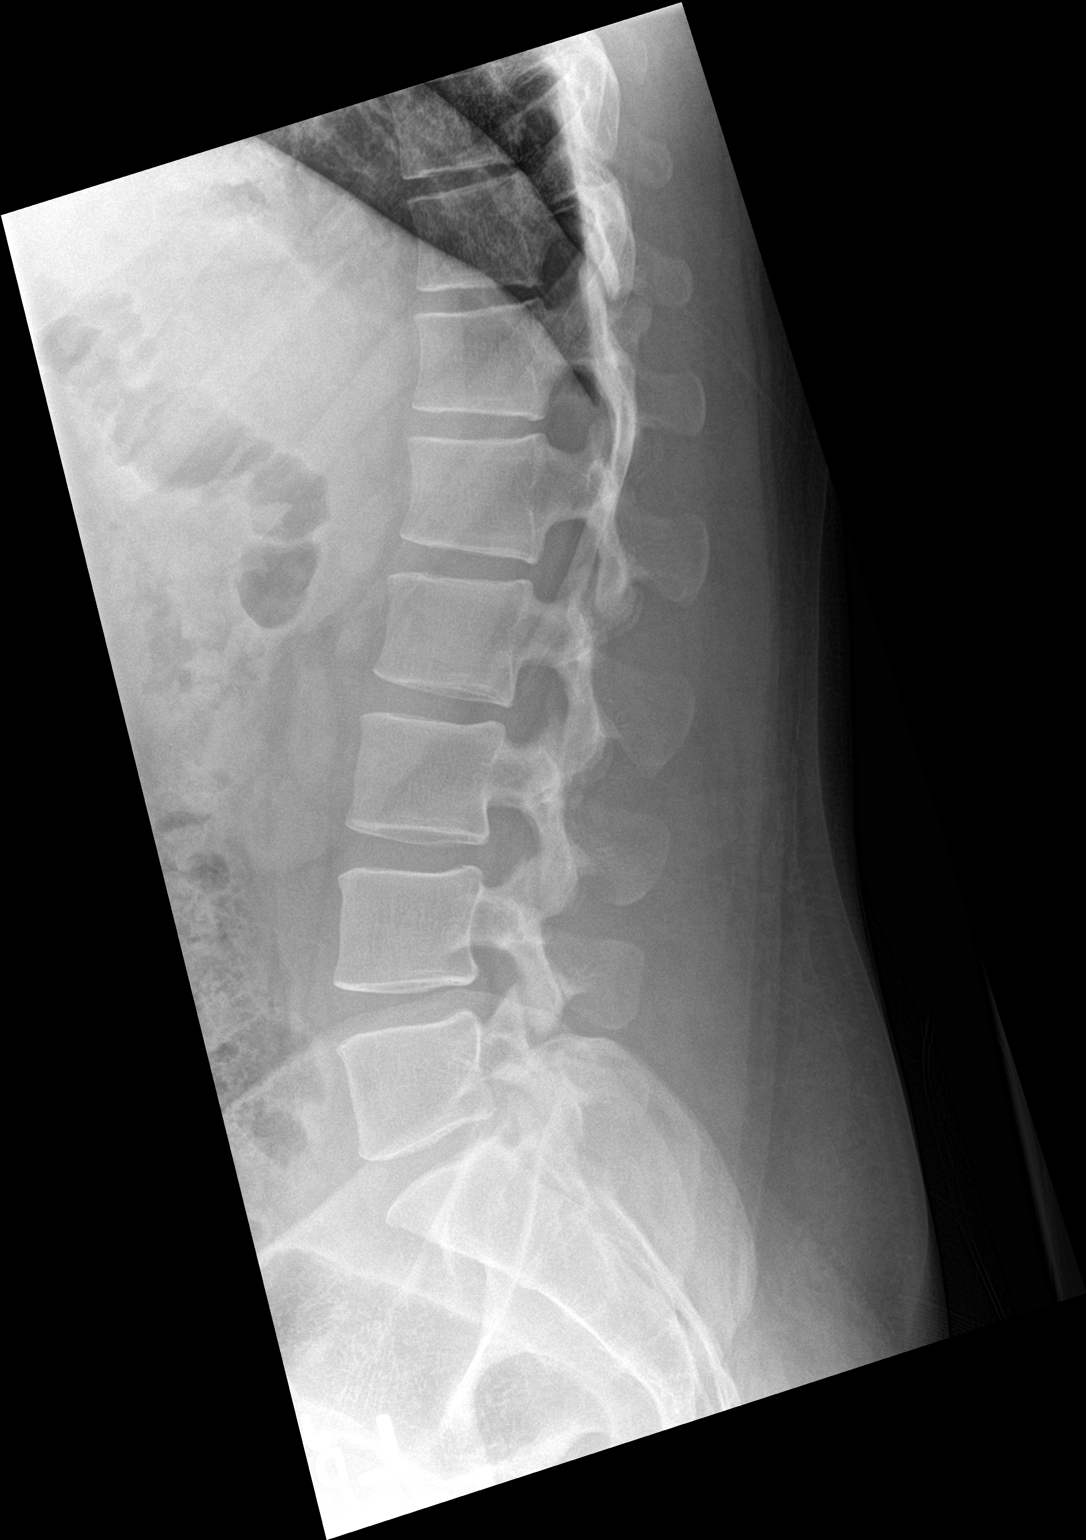

[l-spine spot]
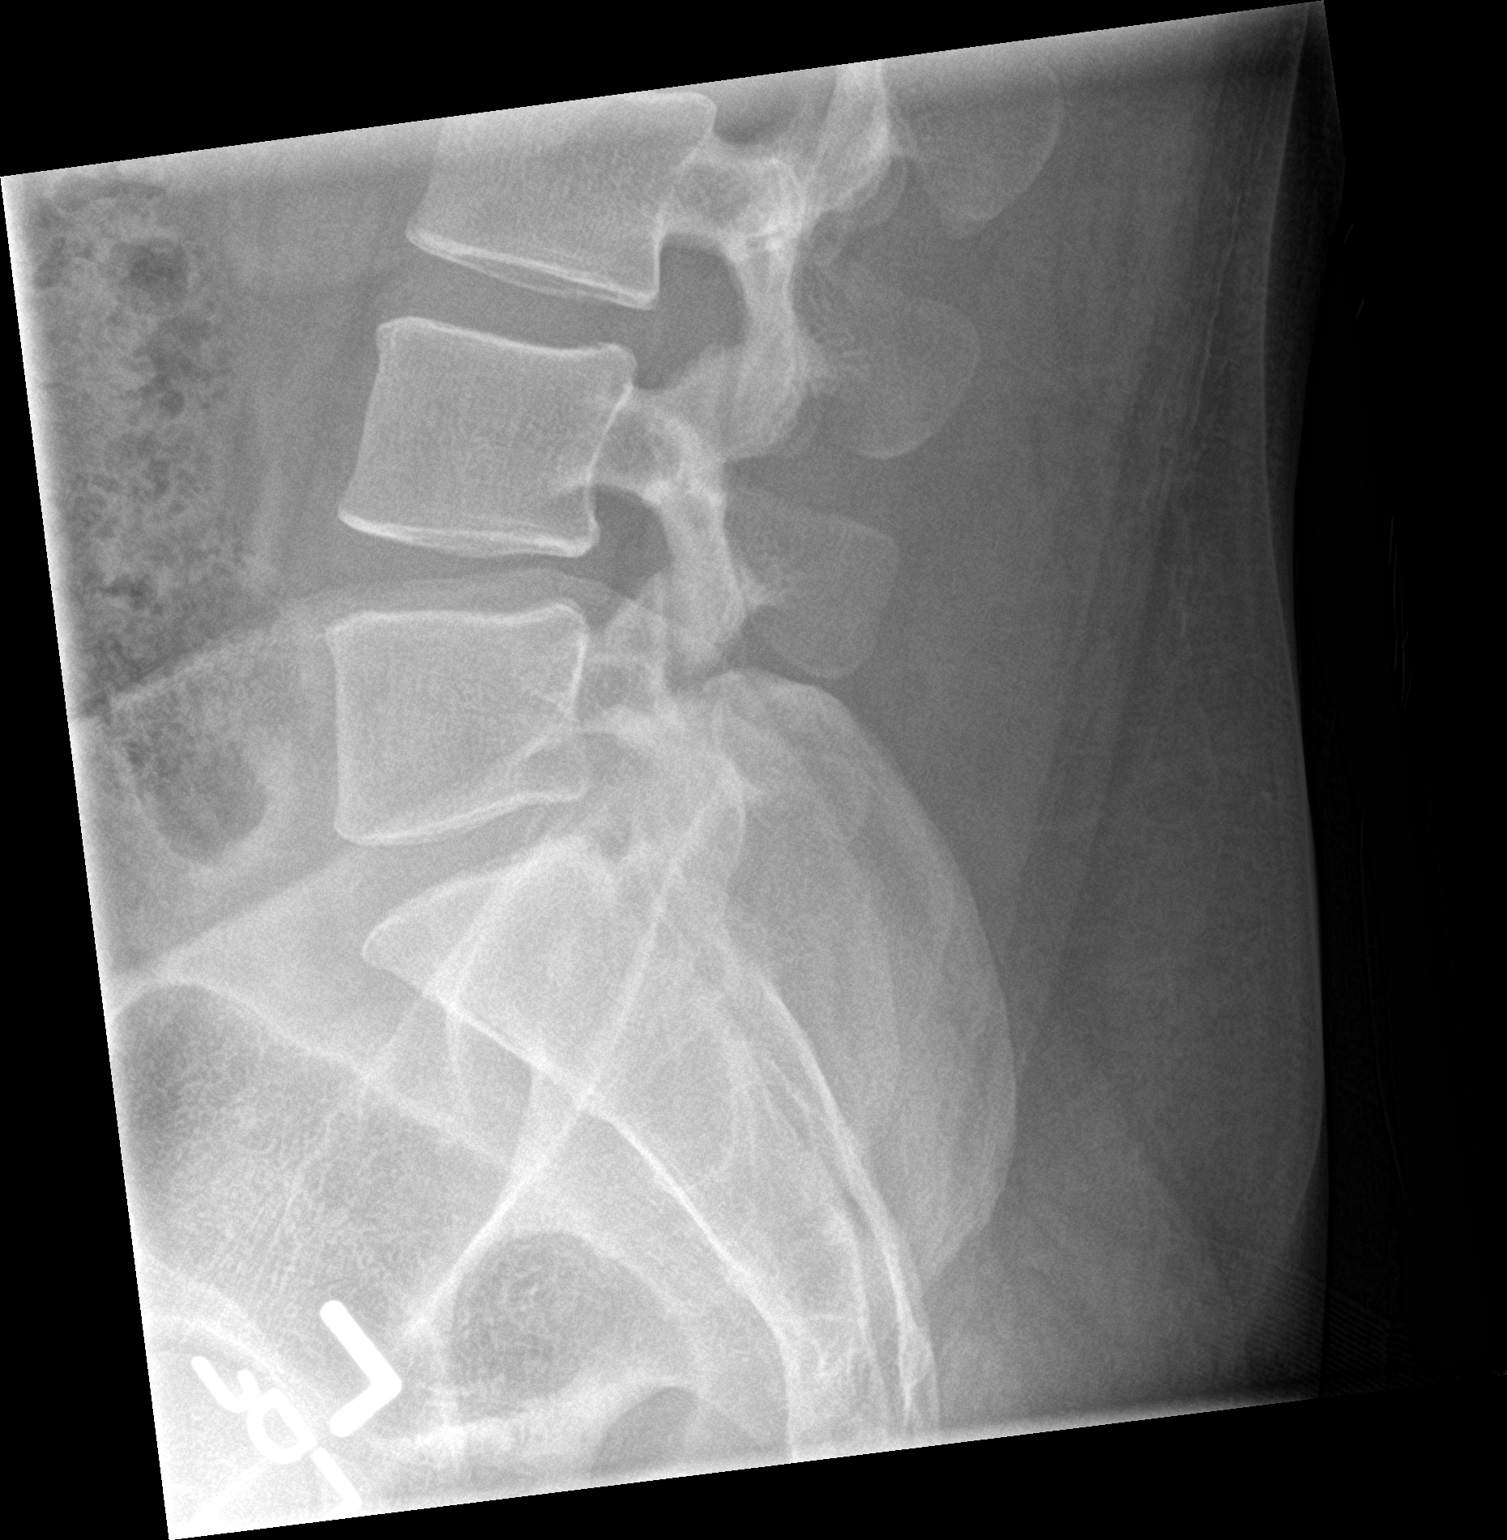

[3 of 3 positions shown; findings below may reference images not displayed]

FINDINGS: The lumbar vertebral bodies are preserved in height. The disc space
heights are well maintained. There is no spondylolisthesis. No pars
defects are observed. There is no significant facet joint
hypertrophy. The pedicles and transverse processes are intact. The
sacrum is unremarkable.
IMPRESSION: There is no acute or chronic bony abnormality of the lumbar spine.

## 2017-01-27 ENCOUNTER — Ambulatory Visit (INDEPENDENT_AMBULATORY_CARE_PROVIDER_SITE_OTHER): Payer: 59 | Admitting: Family Medicine

## 2017-01-27 ENCOUNTER — Encounter: Payer: Self-pay | Admitting: Family Medicine

## 2017-01-27 VITALS — BP 146/90 | HR 98 | Wt 235.4 lb

## 2017-01-27 DIAGNOSIS — E119 Type 2 diabetes mellitus without complications: Secondary | ICD-10-CM | POA: Diagnosis not present

## 2017-01-27 DIAGNOSIS — E1165 Type 2 diabetes mellitus with hyperglycemia: Secondary | ICD-10-CM | POA: Insufficient documentation

## 2017-01-27 DIAGNOSIS — IMO0002 Reserved for concepts with insufficient information to code with codable children: Secondary | ICD-10-CM | POA: Insufficient documentation

## 2017-01-27 DIAGNOSIS — IMO0001 Reserved for inherently not codable concepts without codable children: Secondary | ICD-10-CM

## 2017-01-27 LAB — COMPLETE METABOLIC PANEL WITH GFR
ALBUMIN: 4.6 g/dL (ref 3.6–5.1)
ALT: 76 U/L — ABNORMAL HIGH (ref 9–46)
AST: 56 U/L — AB (ref 10–40)
Alkaline Phosphatase: 150 U/L — ABNORMAL HIGH (ref 40–115)
BILIRUBIN TOTAL: 1.3 mg/dL — AB (ref 0.2–1.2)
BUN: 14 mg/dL (ref 7–25)
CALCIUM: 9.5 mg/dL (ref 8.6–10.3)
CO2: 23 mmol/L (ref 20–31)
Chloride: 97 mmol/L — ABNORMAL LOW (ref 98–110)
Creat: 0.9 mg/dL (ref 0.60–1.35)
GFR, Est African American: >89 mL/min (ref >=60)
GFR, Est Non African American: >89 mL/min (ref >=60)
Glucose, Bld: 301 mg/dL — ABNORMAL HIGH (ref 65–99)
POTASSIUM: 4.5 mmol/L (ref 3.5–5.3)
Sodium: 130 mmol/L — ABNORMAL LOW (ref 135–146)
TOTAL PROTEIN: 7.9 g/dL (ref 6.1–8.1)

## 2017-01-27 LAB — CBC
HEMATOCRIT: 47.8 % (ref 38.5–50.0)
Hemoglobin: 16.8 g/dL (ref 13.2–17.1)
MCH: 34.1 pg — AB (ref 27.0–33.0)
MCHC: 35.1 g/dL (ref 32.0–36.0)
MCV: 97 fL (ref 80.0–100.0)
MPV: 12 fL (ref 7.5–12.5)
Platelets: 155 10*3/uL (ref 140–400)
RBC: 4.93 MIL/uL (ref 4.20–5.80)
RDW: 13.3 % (ref 11.0–15.0)
WBC: 4.9 10*3/uL (ref 3.8–10.8)

## 2017-01-27 LAB — TSH: TSH: 1.39 m[IU]/L (ref 0.40–4.50)

## 2017-01-27 LAB — POCT GLYCOSYLATED HEMOGLOBIN (HGB A1C): Hemoglobin A1C: 10.2

## 2017-01-27 MED ORDER — SITAGLIPTIN PHOSPHATE 100 MG PO TABS: 100.0000 mg | ORAL_TABLET | Freq: Every day | ORAL | 1 refills | Status: DC

## 2017-01-27 MED ORDER — AMBULATORY NON FORMULARY MEDICATION: 0 refills | Status: DC

## 2017-01-27 MED ORDER — EMPAGLIFLOZIN 25 MG PO TABS: 25.0000 mg | ORAL_TABLET | Freq: Every day | ORAL | 1 refills | Status: DC

## 2017-01-27 MED ORDER — METFORMIN HCL 500 MG PO TABS: 500.0000 mg | ORAL_TABLET | Freq: Two times a day (BID) | ORAL | 1 refills | Status: DC

## 2017-01-27 NOTE — Progress Notes (Signed)
Bradley Diaz is a 31 y.o. male who presents to [facility] today for hyperglycemia.   Patient notes a 1 week history of polyuria and polydipsia. He checked his blood sugar using his father's meter and notes that his sugars were 200-500. He denies any fatigue chills vomiting or diarrhea. He has a positive family history for diabetes. He notes a personal past medical history for prediabetes.  No chest pains palpitations shortness of breath.   Past Medical History:  Diagnosis Date  . Acid reflux   . Fatty liver   . IBS (irritable bowel syndrome)    ? per pcp  . Obesity   . Rosacea    No past surgical history on file. Social History  Substance Use Topics  . Smoking status: Former Smoker    Quit date: 12/30/2013  . Smokeless tobacco: Never Used  . Alcohol use 0.0 oz/week     Comment: occasionally   family history includes Diabetes in his father; Heart attack in his father; Lupus in his mother.  ROS as above:  Medications: Current Outpatient Prescriptions  Medication Sig Dispense Refill  . omeprazole (PRILOSEC) 40 MG capsule One by mouth daily at least one hour before a meal. 90 capsule 3  . AMBULATORY NON FORMULARY MEDICATION Single glucometer with lancets, test strips.  Test daily .   E11.65 1 each 0  . empagliflozin (JARDIANCE) 25 MG TABS tablet Take 25 mg by mouth daily. 30 tablet 1  . metFORMIN (GLUCOPHAGE) 500 MG tablet Take 1 tablet (500 mg total) by mouth 2 (two) times daily with a meal. 60 tablet 1  . sitaGLIPtin (JANUVIA) 100 MG tablet Take 1 tablet (100 mg total) by mouth daily. 30 tablet 1   No current facility-administered medications for this visit.    Allergies  Allergen Reactions  . Penicillins     As a child     Health Maintenance Health Maintenance  Topic Date Due  . PNEUMOCOCCAL POLYSACCHARIDE VACCINE (1) 02/17/1987  . FOOT EXAM   02/17/1995  . OPHTHALMOLOGY EXAM  02/17/1995  . INFLUENZA VACCINE  07/22/2016  . HEMOGLOBIN A1C  11/16/2016  . TETANUS/TDAP  08/03/2022  . HIV Screening  Completed     Exam:  BP (!) 146/90   Pulse 98   Wt 235 lb 6.4 oz (106.8 kg)   SpO2 98%   BMI 33.78 kg/m  Gen: Well NAD HEENT: EOMI,  MMM Lungs: Normal work of breathing. CTABL Heart: RRR no MRG Abd: NABS, Soft. Nondistended, Nontender Exts: Brisk capillary refill, warm and well perfused.    Results for orders placed or performed in visit on 01/27/17 (from the past 72 hour(s))  POCT HgB A1C     Status: None   Collection Time: 01/27/17 10:15 AM  Result Value Ref Range   Hemoglobin A1C 10.2    No results found.    Assessment and Plan: 31 y.o. male with Uncontrolled type 2 diabetes. Discussed options. Plan to treat with metformin, Jardiance, and Januvia. We'll check basic labs listed below and recheck in 1-2 weeks. Patient will bring home glucose log with him to the next visit. Diet weight loss and lifestyle discussed in depth.  Recheck blood pressure at that visit if still elevated will likely start lisinopril.   Orders Placed This Encounter  Procedures  . CBC  . COMPLETE METABOLIC PANEL WITH GFR  . TSH  . POCT HgB A1C   Meds ordered this encounter  Medications  . empagliflozin (JARDIANCE) 25 MG TABS tablet    Sig: Take 25 mg by mouth daily.    Dispense:  30 tablet    Refill:  1  . sitaGLIPtin (JANUVIA) 100 MG tablet    Sig: Take 1 tablet (100 mg total) by mouth daily.    Dispense:  30 tablet    Refill:  1  . metFORMIN (GLUCOPHAGE) 500 MG tablet    Sig: Take 1 tablet (500 mg total) by mouth 2 (two) times daily with a meal.    Dispense:  60 tablet    Refill:  1  . AMBULATORY NON FORMULARY MEDICATION    Sig: Single glucometer with lancets, test strips.  Test daily .   E11.65    Dispense:  1 each    Refill:  0     Discussed warning signs or symptoms. Please see discharge instructions. Patient expresses  understanding.  I spent 40 minutes with this patient, greater than 50% was face-to-face time counseling regarding the above diagnosis.

## 2017-01-27 NOTE — Patient Instructions (Addendum)
Thank you for coming in today. Get labs today.  START metformin daily and increase to twice daily in a few days.  START Jardiance START Januvia.   Recheck in 1-2 weeks.  Bring the glucose log with you.   Research a low carb diet.    Diabetes Mellitus and Exercise Exercising regularly is important for your overall health, especially when you have diabetes (diabetes mellitus). Exercising is not only about losing weight. It has many health benefits, such as increasing muscle strength and bone density and reducing body fat and stress. This leads to improved fitness, flexibility, and endurance, all of which result in better overall health. Exercise has additional benefits for people with diabetes, including:  Reducing appetite.  Helping to lower and control blood glucose.  Lowering blood pressure.  Helping to control amounts of fatty substances (lipids) in the blood, such as cholesterol and triglycerides.  Helping the body to respond better to insulin (improving insulin sensitivity).  Reducing how much insulin the body needs.  Decreasing the risk for heart disease by:  Lowering cholesterol and triglyceride levels.  Increasing the levels of good cholesterol.  Lowering blood glucose levels. What is my activity plan? Your health care provider or certified diabetes educator can help you make a plan for the type and frequency of exercise (activity plan) that works for you. Make sure that you:  Do at least 150 minutes of moderate-intensity or vigorous-intensity exercise each week. This could be brisk walking, biking, or water aerobics.  Do stretching and strength exercises, such as yoga or weightlifting, at least 2 times a week.  Spread out your activity over at least 3 days of the week.  Get some form of physical activity every day.  Do not go more than 2 days in a row without some kind of physical activity.  Avoid being inactive for more than 90 minutes at a time. Take frequent  breaks to walk or stretch.  Choose a type of exercise or activity that you enjoy, and set realistic goals.  Start slowly, and gradually increase the intensity of your exercise over time. What do I need to know about managing my diabetes?  Check your blood glucose before and after exercising.  If your blood glucose is higher than 240 mg/dL (13.3 mmol/L) before you exercise, check your urine for ketones. If you have ketones in your urine, do not exercise until your blood glucose returns to normal.  Know the symptoms of low blood glucose (hypoglycemia) and how to treat it. Your risk for hypoglycemia increases during and after exercise. Common symptoms of hypoglycemia can include:  Hunger.  Anxiety.  Sweating and feeling clammy.  Confusion.  Dizziness or feeling light-headed.  Increased heart rate or palpitations.  Blurry vision.  Tingling or numbness around the mouth, lips, or tongue.  Tremors or shakes.  Irritability.  Keep a rapid-acting carbohydrate snack available before, during, and after exercise to help prevent or treat hypoglycemia.  Avoid injecting insulin into areas of the body that are going to be exercised. For example, avoid injecting insulin into:  The arms, when playing tennis.  The legs, when jogging.  Keep records of your exercise habits. Doing this can help you and your health care provider adjust your diabetes management plan as needed. Write down:  Food that you eat before and after you exercise.  Blood glucose levels before and after you exercise.  The type and amount of exercise you have done.  When your insulin is expected to peak, if  you use insulin. Avoid exercising at times when your insulin is peaking.  When you start a new exercise or activity, work with your health care provider to make sure the activity is safe for you, and to adjust your insulin, medicines, or food intake as needed.  Drink plenty of water while you exercise to prevent  dehydration or heat stroke. Drink enough fluid to keep your urine clear or pale yellow. This information is not intended to replace advice given to you by your health care provider. Make sure you discuss any questions you have with your health care provider. Document Released: 02/28/2004 Document Revised: 06/27/2016 Document Reviewed: 05/19/2016 Elsevier Interactive Patient Education  2017 [employer].

## 2017-02-03 ENCOUNTER — Telehealth: Payer: Self-pay | Admitting: *Deleted

## 2017-02-03 NOTE — Telephone Encounter (Signed)
Received PA request for Januvia .Preferred medications are Nesina, Onglyza or Tradjenta.PA was submitted but will most likely be denied since he has not tried one of the above mentioned.

## 2017-02-04 ENCOUNTER — Ambulatory Visit (INDEPENDENT_AMBULATORY_CARE_PROVIDER_SITE_OTHER): Payer: 59 | Admitting: Family Medicine

## 2017-02-04 ENCOUNTER — Ambulatory Visit: Payer: 59 | Admitting: Family Medicine

## 2017-02-04 VITALS — BP 138/101 | HR 112 | Wt 230.0 lb

## 2017-02-04 DIAGNOSIS — E1121 Type 2 diabetes mellitus with diabetic nephropathy: Secondary | ICD-10-CM | POA: Diagnosis not present

## 2017-02-04 DIAGNOSIS — Z23 Encounter for immunization: Secondary | ICD-10-CM

## 2017-02-04 DIAGNOSIS — E1165 Type 2 diabetes mellitus with hyperglycemia: Secondary | ICD-10-CM

## 2017-02-04 DIAGNOSIS — Z2839 Other underimmunization status: Secondary | ICD-10-CM

## 2017-02-04 DIAGNOSIS — I10 Essential (primary) hypertension: Secondary | ICD-10-CM | POA: Diagnosis not present

## 2017-02-04 DIAGNOSIS — R7303 Prediabetes: Secondary | ICD-10-CM | POA: Diagnosis not present

## 2017-02-04 DIAGNOSIS — IMO0001 Reserved for inherently not codable concepts without codable children: Secondary | ICD-10-CM

## 2017-02-04 DIAGNOSIS — Z283 Underimmunization status: Secondary | ICD-10-CM

## 2017-02-04 LAB — POCT UA - MICROALBUMIN

## 2017-02-04 MED ORDER — LOSARTAN POTASSIUM 25 MG PO TABS: 25.0000 mg | ORAL_TABLET | Freq: Every day | ORAL | 0 refills | Status: DC

## 2017-02-04 MED ORDER — SAXAGLIPTIN HCL 5 MG PO TABS
5.0000 mg | ORAL_TABLET | Freq: Every day | ORAL | 1 refills | Status: DC
Start: 2017-02-04 — End: 2017-05-21

## 2017-02-04 NOTE — Telephone Encounter (Signed)
Will switch away from Januvia to Kidron as that is probably the preferred medicine. Both of these medicines do the same thing the same way. They are essentially equivalent

## 2017-02-04 NOTE — Progress Notes (Signed)
Bradley Diaz is a 31 y.o. male who presents to [facility] today for follow up diabetes.  He presented last week with uncontrolled diabetes with polydipsia, polyuria, fatigue, BG 300+, and A1C 10.2. Since then, he has started metformin and jardiance and plans to pick up onglyza soon. He has been drinking and urinating less and feels generally back to normal. Home BG values have decreased over the past week to low-mid 100s fasting and mid 100s to mid 200s postprandial. He has switched back to a plant-based diet and reports losing 6 lbs since his last visit. He was just diagnosed with diabetes last week after previously being prediabetic for some time. He and his wife have not tolerated ACE inhibitors in the past, but she tolerates an ARB well.   Past Medical History:  Diagnosis Date  . Acid reflux   . Fatty liver   . IBS (irritable bowel syndrome)    ? per pcp  . Obesity   . Rosacea    No past surgical history on file. Social History  Substance Use Topics  . Smoking status: Former Smoker    Quit date: 12/30/2013  . Smokeless tobacco: Never Used  . Alcohol use 0.0 oz/week     Comment: occasionally   family history includes Diabetes in his father; Heart attack in his father; Lupus in his mother.  ROS as above:  Medications: Current Outpatient Prescriptions  Medication Sig Dispense Refill  . AMBULATORY NON FORMULARY MEDICATION Single glucometer with lancets, test strips.  Test daily .   E11.65 1 each 0  . empagliflozin (JARDIANCE) 25 MG TABS tablet Take 25 mg by mouth daily. 30 tablet 1  . metFORMIN (GLUCOPHAGE) 500 MG tablet Take 1 tablet (500 mg total) by mouth 2 (two) times daily with a meal. 60 tablet 1  . omeprazole (PRILOSEC) 40 MG capsule One by mouth daily at least one hour before a meal. 90 capsule 3  . saxagliptin HCl (ONGLYZA) 5 MG TABS tablet Take 1  tablet (5 mg total) by mouth daily. 90 tablet 1  . losartan (COZAAR) 25 MG tablet Take 1 tablet (25 mg total) by mouth daily. 90 tablet 0   No current facility-administered medications for this visit.    Allergies  Allergen Reactions  . Penicillins     As a child     Health Maintenance Health Maintenance  Topic Date Due  . PNEUMOCOCCAL POLYSACCHARIDE VACCINE (1) 02/17/1987  . FOOT EXAM  02/17/1995  . OPHTHALMOLOGY EXAM  02/17/1995  . URINE MICROALBUMIN  02/17/1995  . INFLUENZA VACCINE  07/22/2016  . HEMOGLOBIN A1C  07/27/2017  . TETANUS/TDAP  08/03/2022  . HIV Screening  Completed    Exam:  BP (!) 138/101   Pulse (!) 112   Wt 230 lb (104.3 kg)   BMI 33.00 kg/m  Gen: Well NAD HEENT: EOMI,  MMM Lungs: Normal work of breathing. CTABL Heart: RRR no MRG Abd: NABS, Soft. Nondistended, Nontender Exts: Brisk capillary refill, warm and well perfused.   Diabetic Foot Exam - Simple   Simple Foot Form Visual Inspection No deformities, no ulcerations, no other skin breakdown bilaterally:  Yes Sensation Testing Intact to touch and monofilament testing bilaterally:  Yes Pulse Check Posterior Tibialis and Dorsalis pulse intact bilaterally:  Yes Comments    Urine w/ 2+ microalbumin  No results found for this or any previous visit (from the past 72 hour(s)). No results found.  Assessment and Plan: 31 y.o. male with recently diagnosed diabetes. Symptoms of polyuria, polydipsia, and fatigue have resolved from last week, and home BG values have improved significantly with metformin and jardiance. Anticipate further improvement with the addition of onglyza and diet/exercise changes. - Continue metformin, jardiance, onglyza - Add losartan given HTN and microalbuminuria - Follow up in 1 month; recheck CMP   Orders Placed This Encounter  Procedures  . POCT UA - Microalbumin   Meds ordered this encounter  Medications  . losartan (COZAAR) 25 MG tablet    Sig: Take 1 tablet  (25 mg total) by mouth daily.    Dispense:  90 tablet    Refill:  0     Discussed warning signs or symptoms. Please see discharge instructions. Patient expresses understanding.

## 2017-02-04 NOTE — Patient Instructions (Addendum)
Thank you for coming in today. Continue current medicines.  Start Onglyza and Losartan.  Recheck in 1 month.  We will recheck kidney and liver labs.    Losartan tablets What is this medicine? LOSARTAN (loe SAR tan) is used to treat high blood pressure and to reduce the risk of stroke in certain patients. This drug also slows the progression of kidney disease in patients with diabetes. This medicine may be used for other purposes; ask your health care provider or pharmacist if you have questions. COMMON BRAND NAME(S): Cozaar What should I tell my health care provider before I take this medicine? They need to know if you have any of these conditions: -heart failure -kidney or liver disease -an unusual or allergic reaction to losartan, other medicines, foods, dyes, or preservatives -pregnant or trying to get pregnant -breast-feeding How should I use this medicine? Take this medicine by mouth with a glass of water. Follow the directions on the prescription label. This medicine can be taken with or without food. Take your doses at regular intervals. Do not take your medicine more often than directed. Talk to your pediatrician regarding the use of this medicine in children. Special care may be needed. Overdosage: If you think you have taken too much of this medicine contact a poison control center or emergency room at once. NOTE: This medicine is only for you. Do not share this medicine with others. What if I miss a dose? If you miss a dose, take it as soon as you can. If it is almost time for your next dose, take only that dose. Do not take double or extra doses. What may interact with this medicine? -blood pressure medicines -diuretics, especially triamterene, spironolactone, or amiloride -fluconazole -NSAIDs, medicines for pain and inflammation, like ibuprofen or naproxen -potassium salts or potassium supplements -rifampin This list may not describe all possible interactions. Give your  health care provider a list of all the medicines, herbs, non-prescription drugs, or dietary supplements you use. Also tell them if you smoke, drink alcohol, or use illegal drugs. Some items may interact with your medicine. What should I watch for while using this medicine? Visit your doctor or health care professional for regular checks on your progress. Check your blood pressure as directed. Ask your doctor or health care professional what your blood pressure should be and when you should contact him or her. Call your doctor or health care professional if you notice an irregular or fast heart beat. Women should inform their doctor if they wish to become pregnant or think they might be pregnant. There is a potential for serious side effects to an unborn child, particularly in the second or third trimester. Talk to your health care professional or pharmacist for more information. You may get drowsy or dizzy. Do not drive, use machinery, or do anything that needs mental alertness until you know how this drug affects you. Do not stand or sit up quickly, especially if you are an older patient. This reduces the risk of dizzy or fainting spells. Alcohol can make you more drowsy and dizzy. Avoid alcoholic drinks. Avoid salt substitutes unless you are told otherwise by your doctor or health care professional. Do not treat yourself for coughs, colds, or pain while you are taking this medicine without asking your doctor or health care professional for advice. Some ingredients may increase your blood pressure. What side effects may I notice from receiving this medicine? Side effects that you should report to your doctor or health  care professional as soon as possible: -confusion, dizziness, light headedness or fainting spells -decreased amount of urine passed -difficulty breathing or swallowing, hoarseness, or tightening of the throat -fast or irregular heart beat, palpitations, or chest pain -skin rash,  itching -swelling of your face, lips, tongue, hands, or feet Side effects that usually do not require medical attention (report to your doctor or health care professional if they continue or are bothersome): -cough -decreased sexual function or desire -headache -nasal congestion or stuffiness -nausea or stomach pain -sore or cramping muscles This list may not describe all possible side effects. Call your doctor for medical advice about side effects. You may report side effects to FDA at 1-800-FDA-1088. Where should I keep my medicine? Keep out of the reach of children. Store at room temperature between 15 and 30 degrees C (59 and 86 degrees F). Protect from light. Keep container tightly closed. Throw away any unused medicine after the expiration date. NOTE: This sheet is a summary. It may not cover all possible information. If you have questions about this medicine, talk to your doctor, pharmacist, or health care provider.  2017 Elsevier/Gold Standard (2008-02-18 16:42:18)

## 2017-02-05 NOTE — Telephone Encounter (Signed)
Patient was notified at visit yesterday of the change and given a coupon card

## 2017-02-24 ENCOUNTER — Other Ambulatory Visit: Payer: Self-pay | Admitting: Family Medicine

## 2017-03-04 ENCOUNTER — Ambulatory Visit (INDEPENDENT_AMBULATORY_CARE_PROVIDER_SITE_OTHER): Payer: 59 | Admitting: Family Medicine

## 2017-03-04 VITALS — BP 128/87 | HR 101 | Wt 223.0 lb

## 2017-03-04 DIAGNOSIS — IMO0001 Reserved for inherently not codable concepts without codable children: Secondary | ICD-10-CM

## 2017-03-04 DIAGNOSIS — E1165 Type 2 diabetes mellitus with hyperglycemia: Secondary | ICD-10-CM

## 2017-03-04 MED ORDER — EMPAGLIFLOZIN-METFORMIN HCL 12.5-1000 MG PO TABS: 1.0000 | ORAL_TABLET | Freq: Two times a day (BID) | ORAL | 4 refills | Status: DC

## 2017-03-04 NOTE — Progress Notes (Signed)
       Bradley Diaz is a 31 y.o. male who presents to [facility] today for follow-up diabetes. Patient was diagnosed with diabetes in early February. He's been taking 500 mg of metformin twice daily, 25 mg of Jardiance once daily, and 5 mg of Onglyza once daily. He notes he tolerates his medications very well. Denies significant polyuria or polydipsia. He denies any significant hyper or hypoglycemic episodes. He notes his average sugars are around 120-160 at times. Additionally he was started on losartan and tolerates his medications well.  Past Medical History:  Diagnosis Date  . Acid reflux   . Fatty liver   . IBS (irritable bowel syndrome)    ? per pcp  . Obesity   . Rosacea    No past surgical history on file. Social History  Substance Use Topics  . Smoking status: Former Smoker    Quit date: 12/30/2013  . Smokeless tobacco: Never Used  . Alcohol use 0.0 oz/week     Comment: occasionally   family history includes Diabetes in his father; Heart attack in his father; Lupus in his mother.  ROS as above:  Medications: Current Outpatient Prescriptions  Medication Sig Dispense Refill  . AMBULATORY NON FORMULARY MEDICATION Single glucometer with lancets, test strips.  Test daily .   E11.65 1 each 0  . losartan (COZAAR) 25 MG tablet Take 1 tablet (25 mg total) by mouth daily. 90 tablet 0  . omeprazole (PRILOSEC) 40 MG capsule One by mouth daily at least one hour before a meal. 90 capsule 3  . saxagliptin HCl (ONGLYZA) 5 MG TABS tablet Take 1 tablet (5 mg total) by mouth daily. 90 tablet 1  . TRUE METRIX BLOOD GLUCOSE TEST test strip TEST DAILY 50 each 0  . Empagliflozin-Metformin HCl (SYNJARDY) 12.04-999 MG TABS Take 1 tablet by mouth 2 (two) times daily. 60 tablet 4   No current facility-administered medications for this visit.    Allergies  Allergen Reactions  .  Penicillins     As a child     Health Maintenance Health Maintenance  Topic Date Due  . FOOT EXAM  02/17/1995  . OPHTHALMOLOGY EXAM  02/17/1995  . INFLUENZA VACCINE  07/22/2016  . HEMOGLOBIN A1C  07/27/2017  . PNEUMOCOCCAL POLYSACCHARIDE VACCINE (2) 02/04/2022  . TETANUS/TDAP  08/03/2022  . HIV Screening  Completed     Exam:  BP 128/87   Pulse (!) 101   Wt 223 lb (101.2 kg)   BMI 32.00 kg/m  Gen: Well NAD HEENT: EOMI,  MMM Lungs: Normal work of breathing. CTABL Heart: RRR no MRG Abd: NABS, Soft. Nondistended, Nontender Exts: Brisk capillary refill, warm and well perfused.    No results found for this or any previous visit (from the past 72 hour(s)). No results found.    Assessment and Plan: 31 y.o. male with diabetes: Patient is doing well. Will switch to combined Jardiance/metformin with Synjardy. We will increase total metformin dose to 1000 mg twice daily. Recheck in about 2-1/2 months.   No orders of the defined types were placed in this encounter.  Meds ordered this encounter  Medications  . Empagliflozin-Metformin HCl (SYNJARDY) 12.04-999 MG TABS    Sig: Take 1 tablet by mouth 2 (two) times daily.    Dispense:  60 tablet    Refill:  4     Discussed warning signs or symptoms. Please see discharge instructions. Patient expresses understanding.

## 2017-03-04 NOTE — Patient Instructions (Signed)
Thank you for coming in today. Continue weight loss and exercise.  When you are about to run out of Jardiance bring the prescription for I[facility] with metformin in it) to the pharmacy. This will replace both metformin and Jardiance as it contains both.  Work to increasing the metformin to 1000mg  twice daily. This is 2 pills of the 500 twice daily.   We will want to recheck in late May or early June.   Let me know if you are having problems.    Diabetes Mellitus and Exercise Exercising regularly is important for your overall health, especially when you have diabetes (diabetes mellitus). Exercising is not only about losing weight. It has many health benefits, such as increasing muscle strength and bone density and reducing body fat and stress. This leads to improved fitness, flexibility, and endurance, all of which result in better overall health. Exercise has additional benefits for people with diabetes, including:  Reducing appetite.  Helping to lower and control blood glucose.  Lowering blood pressure.  Helping to control amounts of fatty substances (lipids) in the blood, such as cholesterol and triglycerides.  Helping the body to respond better to insulin (improving insulin sensitivity).  Reducing how much insulin the body needs.  Decreasing the risk for heart disease by:  Lowering cholesterol and triglyceride levels.  Increasing the levels of good cholesterol.  Lowering blood glucose levels. What is my activity plan? Your health care provider or certified diabetes educator can help you make a plan for the type and frequency of exercise (activity plan) that works for you. Make sure that you:  Do at least 150 minutes of moderate-intensity or vigorous-intensity exercise each week. This could be brisk walking, biking, or water aerobics.  Do stretching and strength exercises, such as yoga or weightlifting, at least 2 times a week.  Spread out your activity over at  least 3 days of the week.  Get some form of physical activity every day.  Do not go more than 2 days in a row without some kind of physical activity.  Avoid being inactive for more than 90 minutes at a time. Take frequent breaks to walk or stretch.  Choose a type of exercise or activity that you enjoy, and set realistic goals.  Start slowly, and gradually increase the intensity of your exercise over time. What do I need to know about managing my diabetes?  Check your blood glucose before and after exercising.  If your blood glucose is higher than 240 mg/dL (13.3 mmol/L) before you exercise, check your urine for ketones. If you have ketones in your urine, do not exercise until your blood glucose returns to normal.  Know the symptoms of low blood glucose (hypoglycemia) and how to treat it. Your risk for hypoglycemia increases during and after exercise. Common symptoms of hypoglycemia can include:  Hunger.  Anxiety.  Sweating and feeling clammy.  Confusion.  Dizziness or feeling light-headed.  Increased heart rate or palpitations.  Blurry vision.  Tingling or numbness around the mouth, lips, or tongue.  Tremors or shakes.  Irritability.  Keep a rapid-acting carbohydrate snack available before, during, and after exercise to help prevent or treat hypoglycemia.  Avoid injecting insulin into areas of the body that are going to be exercised. For example, avoid injecting insulin into:  The arms, when playing tennis.  The legs, when jogging.  Keep records of your exercise habits. Doing this can help you and your health care provider adjust your diabetes management plan as needed.  Write down:  Food that you eat before and after you exercise.  Blood glucose levels before and after you exercise.  The type and amount of exercise you have done.  When your insulin is expected to peak, if you use insulin. Avoid exercising at times when your insulin is peaking.  When you start  a new exercise or activity, work with your health care provider to make sure the activity is safe for you, and to adjust your insulin, medicines, or food intake as needed.  Drink plenty of water while you exercise to prevent dehydration or heat stroke. Drink enough fluid to keep your urine clear or pale yellow. This information is not intended to replace advice given to you by your health care provider. Make sure you discuss any questions you have with your health care provider. Document Released: 02/28/2004 Document Revised: 06/27/2016 Document Reviewed: 05/19/2016 Elsevier Interactive Patient Education  2017 [employer].

## 2017-04-13 ENCOUNTER — Other Ambulatory Visit: Payer: Self-pay | Admitting: Family Medicine

## 2017-05-04 ENCOUNTER — Other Ambulatory Visit: Payer: Self-pay | Admitting: Family Medicine

## 2017-05-21 ENCOUNTER — Encounter: Payer: Self-pay | Admitting: Family Medicine

## 2017-05-21 ENCOUNTER — Ambulatory Visit (INDEPENDENT_AMBULATORY_CARE_PROVIDER_SITE_OTHER): Payer: 59 | Admitting: Family Medicine

## 2017-05-21 VITALS — BP 132/87 | HR 84 | Ht 70.0 in | Wt 216.0 lb

## 2017-05-21 DIAGNOSIS — E1165 Type 2 diabetes mellitus with hyperglycemia: Secondary | ICD-10-CM

## 2017-05-21 DIAGNOSIS — E1121 Type 2 diabetes mellitus with diabetic nephropathy: Secondary | ICD-10-CM | POA: Diagnosis not present

## 2017-05-21 DIAGNOSIS — IMO0001 Reserved for inherently not codable concepts without codable children: Secondary | ICD-10-CM

## 2017-05-21 LAB — POCT GLYCOSYLATED HEMOGLOBIN (HGB A1C): HEMOGLOBIN A1C: 4.8

## 2017-05-21 MED ORDER — METFORMIN HCL 1000 MG PO TABS: 1000.0000 mg | ORAL_TABLET | Freq: Two times a day (BID) | ORAL | 3 refills | Status: DC

## 2017-05-21 NOTE — Patient Instructions (Addendum)
Thank you for coming in today. You are doing great! STOP synjardi and Onglyza.  Start metformin twice daily.  Recheck in about 3 months.  Continue low carb diet.  I recommend adding 20-30 mins of moderate exercise most of the days of the week.   Let me know ahead of time and I will order fasting labs to be done before the next visit.

## 2017-05-21 NOTE — Progress Notes (Signed)
Bradley Diaz is a 31 y.o. male who presents to [facility]: [facility] today for follow up DM.  He checks his blood sugar on occasion and it has been around 90-120 consistently. He has been following a low carb diet and recently got a fitness watch to help him increase his activity level.  He has lost weight and feels really good today.  Would like to go off some medications if possible.  He denies polyuria polydipsia N/V   Past Medical History:  Diagnosis Date  . Acid reflux   . Fatty liver   . IBS (irritable bowel syndrome)    ? per pcp  . Obesity   . Rosacea    No past surgical history on file. Social History  Substance Use Topics  . Smoking status: Former Smoker    Quit date: 12/30/2013  . Smokeless tobacco: Never Used  . Alcohol use 0.0 oz/week     Comment: occasionally   family history includes Diabetes in his father; Heart attack in his father; Lupus in his mother.  ROS as above:  Medications: Current Outpatient Prescriptions  Medication Sig Dispense Refill  . AMBULATORY NON FORMULARY MEDICATION Single glucometer with lancets, test strips.  Test daily .   E11.65 1 each 0  . losartan (COZAAR) 25 MG tablet TAKE ONE TABLET BY MOUTH EVERY DAY 30 tablet 0  . TRUE METRIX BLOOD GLUCOSE TEST test strip TEST DAILY 50 each 0  . metFORMIN (GLUCOPHAGE) 1000 MG tablet Take 1 tablet (1,000 mg total) by mouth 2 (two) times daily with a meal. 180 tablet 3  . omeprazole (PRILOSEC) 40 MG capsule One by mouth daily at least one hour before a meal. 90 capsule 3   No current facility-administered medications for this visit.    Allergies  Allergen Reactions  . Penicillins     As a child     Health Maintenance Health Maintenance  Topic Date Due  . OPHTHALMOLOGY EXAM  02/17/1995  . INFLUENZA VACCINE  07/22/2017  . HEMOGLOBIN A1C  07/27/2017  . FOOT EXAM  03/04/2018  .  PNEUMOCOCCAL POLYSACCHARIDE VACCINE (2) 02/04/2022  . TETANUS/TDAP  08/03/2022  . HIV Screening  Completed     Exam:  BP 132/87   Pulse 84   Ht 5\' 10"  (1.778 m)   Wt 216 lb (98 kg)   BMI 30.99 kg/m  Gen: Well NAD HEENT: EOMI,  MMM Lungs: Normal work of breathing. CTABL Heart: RRR no MRG Abd: NABS, Soft. Nondistended, Nontender Exts: Brisk capillary refill, warm and well perfused.    Results for orders placed or performed in visit on 05/21/17 (from the past 72 hour(s))  POCT HgB A1C     Status: None   Collection Time: 05/21/17  3:40 PM  Result Value Ref Range   Hemoglobin A1C 4.8    No results found.    Assessment and Plan: 31 y.o. male with controlled DM presents today for follow up.  Diet and weight loss have greatly improved his glycemic control and his A1C is down to 4.8 today.  Will try stopping the synjardy and onglyza and starting metformin with focus on lifestyle changes for diabetic control.  Check blood sugars more frequently and reconsider meds if unable to keep blood glucose down.  Patient has done a lot of work on lifestyle changes and I am optimistic he will be able to control his DM without medication at some point. Recheck A1C in  3 months and continue current regiment if A1c stays below 6.5.  Recheck lipids at next visit  Orders Placed This Encounter  Procedures  . POCT HgB A1C   Meds ordered this encounter  Medications  . metFORMIN (GLUCOPHAGE) 1000 MG tablet    Sig: Take 1 tablet (1,000 mg total) by mouth 2 (two) times daily with a meal.    Dispense:  180 tablet    Refill:  3     Discussed warning signs or symptoms. Please see discharge instructions. Patient expresses understanding.

## 2017-06-01 ENCOUNTER — Other Ambulatory Visit: Payer: Self-pay | Admitting: Family Medicine

## 2017-06-01 DIAGNOSIS — K219 Gastro-esophageal reflux disease without esophagitis: Secondary | ICD-10-CM

## 2017-06-03 ENCOUNTER — Telehealth: Payer: Self-pay

## 2017-06-03 NOTE — Telephone Encounter (Signed)
FYI: Pt called to provide an update on glucose readings since medication changes made at [facility] per Gregor Hams, MD request. States that fasting morning readings have been about 115 on average. States there have been no notable complications since changing meds.

## 2017-06-05 NOTE — Telephone Encounter (Signed)
That's great.

## 2017-08-01 ENCOUNTER — Other Ambulatory Visit: Payer: Self-pay | Admitting: Family Medicine

## 2017-08-20 ENCOUNTER — Encounter: Payer: Self-pay | Admitting: Family Medicine

## 2017-08-20 ENCOUNTER — Ambulatory Visit (INDEPENDENT_AMBULATORY_CARE_PROVIDER_SITE_OTHER): Payer: 59 | Admitting: Family Medicine

## 2017-08-20 VITALS — BP 125/98 | HR 82 | Wt 225.0 lb

## 2017-08-20 DIAGNOSIS — I10 Essential (primary) hypertension: Secondary | ICD-10-CM

## 2017-08-20 DIAGNOSIS — Z23 Encounter for immunization: Secondary | ICD-10-CM

## 2017-08-20 DIAGNOSIS — E1121 Type 2 diabetes mellitus with diabetic nephropathy: Secondary | ICD-10-CM | POA: Diagnosis not present

## 2017-08-20 DIAGNOSIS — K760 Fatty (change of) liver, not elsewhere classified: Secondary | ICD-10-CM

## 2017-08-20 LAB — POCT GLYCOSYLATED HEMOGLOBIN (HGB A1C): Hemoglobin A1C: 5

## 2017-08-20 MED ORDER — LOSARTAN POTASSIUM 25 MG PO TABS: 25.0000 mg | ORAL_TABLET | Freq: Every day | ORAL | 1 refills | Status: DC

## 2017-08-20 MED ORDER — GLUCOSE BLOOD VI STRP: ORAL_STRIP | 99 refills | Status: AC

## 2017-08-20 NOTE — Patient Instructions (Signed)
Thank you for coming in today. Continue losartan for blood pressure and to protect your kidneys.  STOP metformin.  Continue diabetes diet.    Get fasting labs in the near future.   Recheck with me in 3 months.   Call or go to the emergency room if you get worse, have trouble breathing, have chest pains, or palpitations.

## 2017-08-20 NOTE — Progress Notes (Signed)
Bradley Diaz is a 31 y.o. male who presents to [facility]: [facility] today for follow-up of diabetes.  Patient reports adhering to a whole-food plant-based diet and limiting carbohydrates to <60g per meal. He reports that he had some trouble keeping up with this recently due to family and life demands. He has not increased his exercise yet, however, he has remodeled his guestroom into a fitness room and plans to start soon. Patient reports tolerating metformin without any adverse side effects. Patient reports averaging 120 when checking blood sugar in the morning.   Hypertension: Patient continues to take losartan 25 mg daily. He denies chest pain palpitations or shortness of breath.  Transaminitis: Patient was previously found to have transaminitis. He is significantly improved his diet since. He denies any abdominal pain.  Patient has not scheduled an eye exam yet.   Patient denies any chest pain, shortness of breath, vision changes, changes in urination, abdominal pain, or changes in bowel movements.   Past Medical History:  Diagnosis Date  . Acid reflux   . Fatty liver   . IBS (irritable bowel syndrome)    ? per pcp  . Obesity   . Rosacea    No past surgical history on file. Social History  Substance Use Topics  . Smoking status: Former Smoker    Quit date: 12/30/2013  . Smokeless tobacco: Never Used  . Alcohol use 0.0 oz/week     Comment: occasionally   family history includes Diabetes in his father; Heart attack in his father; Lupus in his mother.  ROS as above:  Medications: Current Outpatient Prescriptions  Medication Sig Dispense Refill  . AMBULATORY NON FORMULARY MEDICATION Single glucometer with lancets, test strips.  Test daily .   E11.65 1 each 0  . glucose blood (TRUE METRIX BLOOD GLUCOSE TEST) test strip TEST DAILY 100 each prn  . losartan (COZAAR)  25 MG tablet Take 1 tablet (25 mg total) by mouth daily. 90 tablet 1  . omeprazole (PRILOSEC) 40 MG capsule TAKE ONE CAPSULE BY MOUTH EVERY DAY AT LEAST 1 HOUR BEFORE A MEAL 30 capsule 11   No current facility-administered medications for this visit.    Allergies  Allergen Reactions  . Penicillins     As a child     Health Maintenance Health Maintenance  Topic Date Due  . OPHTHALMOLOGY EXAM  02/17/1995  . INFLUENZA VACCINE  07/22/2017  . HEMOGLOBIN A1C  11/20/2017  . FOOT EXAM  03/04/2018  . PNEUMOCOCCAL POLYSACCHARIDE VACCINE (2) 02/04/2022  . TETANUS/TDAP  08/03/2022  . HIV Screening  Completed     Exam:  BP (!) 125/98   Pulse 82   Wt 225 lb (102.1 kg)   BMI 32.28 kg/m  Gen: Well NAD, sitting comfortably in chair HEENT: EOMI,  MMM Lungs: Normal work of breathing. CTABL Heart: RRR, normal S1 and S2, no MRG Abd: NABS, Soft. Nondistended, Nontender Exts: Brisk capillary refill, warm and well perfused.    Results for orders placed or performed in visit on 08/20/17 (from the past 72 hour(s))  POCT HgB A1C     Status: Abnormal   Collection Time: 08/20/17  4:06 PM  Result Value Ref Range   Hemoglobin A1C 5.0    No results found.    Assessment and Plan: 31 y.o. male presenting for follow-up of diabetes. Given that his last two A1c readings have been 5.0% or less, it is reasonable to discontinue metformin  at this time. Patient was advised to continue his diet changes and checking his blood sugar regularly.   Hypertension: Patient should continue taking losartan at this time given that his diastolic blood pressure was elevated.    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  I anticipate significant improvement.   Patient will follow-up in 3 months for recheck. If he continues to have excellent A1c readings at this time then he will be able to follow-up less frequently.   Flu vaccine given today prior to discharge. Orders Placed  This Encounter  Procedures  . Flu Vaccine QUAD 36+ mos IM  . Lipid Panel w/reflex Direct LDL  . COMPLETE METABOLIC PANEL WITH GFR  . POCT HgB A1C   Meds ordered this encounter  Medications  . losartan (COZAAR) 25 MG tablet    Sig: Take 1 tablet (25 mg total) by mouth daily.    Dispense:  90 tablet    Refill:  1  . glucose blood (TRUE METRIX BLOOD GLUCOSE TEST) test strip    Sig: TEST DAILY    Dispense:  100 each    Refill:  prn     Discussed warning signs or symptoms. Please see discharge instructions. Patient expresses understanding.

## 2017-09-10 LAB — LIPID PANEL W/REFLEX DIRECT LDL
CHOL/HDL RATIO: 5.8 (calc) — AB (ref <5.0)
CHOLESTEROL: 179 mg/dL (ref <200)
HDL: 31 mg/dL — AB (ref >40)
LDL CHOLESTEROL (CALC): 111 mg/dL — AB
Non-HDL Cholesterol (Calc): 148 mg/dL (calc) — ABNORMAL HIGH (ref <130)
TRIGLYCERIDES: 244 mg/dL — AB (ref <150)

## 2017-09-10 LAB — COMPLETE METABOLIC PANEL WITH GFR
AG RATIO: 1.4 (calc) (ref 1.0–2.5)
ALKALINE PHOSPHATASE (APISO): 69 U/L (ref 40–115)
ALT: 89 U/L — ABNORMAL HIGH (ref 9–46)
AST: 47 U/L — AB (ref 10–40)
Albumin: 4.4 g/dL (ref 3.6–5.1)
BILIRUBIN TOTAL: 0.7 mg/dL (ref 0.2–1.2)
BUN: 13 mg/dL (ref 7–25)
CO2: 28 mmol/L (ref 20–32)
Calcium: 9.5 mg/dL (ref 8.6–10.3)
Chloride: 102 mmol/L (ref 98–110)
Creat: 0.93 mg/dL (ref 0.60–1.35)
GFR, EST NON AFRICAN AMERICAN: 108 mL/min/{1.73_m2} (ref >=60)
GFR, Est African American: 125 mL/min/{1.73_m2} (ref >=60)
GLOBULIN: 3.2 g/dL (ref 1.9–3.7)
Glucose, Bld: 116 mg/dL — ABNORMAL HIGH (ref 65–99)
POTASSIUM: 4.8 mmol/L (ref 3.5–5.3)
SODIUM: 136 mmol/L (ref 135–146)
Total Protein: 7.6 g/dL (ref 6.1–8.1)

## 2017-10-28 ENCOUNTER — Ambulatory Visit: Payer: 59 | Admitting: Family Medicine

## 2017-11-25 ENCOUNTER — Ambulatory Visit: Payer: 59 | Admitting: Family Medicine

## 2017-12-23 ENCOUNTER — Ambulatory Visit: Payer: 59 | Admitting: Family Medicine

## 2017-12-23 ENCOUNTER — Encounter: Payer: Self-pay | Admitting: Family Medicine

## 2017-12-23 VITALS — BP 130/92 | HR 89 | Wt 229.0 lb

## 2017-12-23 DIAGNOSIS — R7303 Prediabetes: Secondary | ICD-10-CM

## 2017-12-23 DIAGNOSIS — R7309 Other abnormal glucose: Secondary | ICD-10-CM

## 2017-12-23 DIAGNOSIS — B353 Tinea pedis: Secondary | ICD-10-CM | POA: Insufficient documentation

## 2017-12-23 DIAGNOSIS — I10 Essential (primary) hypertension: Secondary | ICD-10-CM | POA: Diagnosis not present

## 2017-12-23 LAB — POCT GLYCOSYLATED HEMOGLOBIN (HGB A1C): HEMOGLOBIN A1C: 5.6

## 2017-12-23 NOTE — Patient Instructions (Addendum)
Thank you for coming in today. Recheck in 5 months Continue current medicines and low carb diet.  Remind me and I will order fasting labs before the next check.  Return sooner if needed.

## 2017-12-23 NOTE — Progress Notes (Signed)
Bradley Diaz is a 32 y.o. male who presents to [facility]: [place] today for follow-up of diabetes, and hypertension and tinea pedis.  Diabetes: [name] has a history of diabetes/prediabetes.  He lost weight and has been using diet control recently.  He notes his blood sugars are typically in the 120s range.  He continues a low carbohydrate diet and feels well.  No polyuria or polydipsia.  Hypertension: Temp notes mild elevated blood pressure.  He takes losartan daily and denies chest pain palpitations or shortness of breath.  Patient was found to have tinea pedis on diabetic foot exam today.  He denies significant itching or pain.  He has not tried any treatment yet.   Past Medical History:  Diagnosis Date  . Acid reflux   . Fatty liver   . IBS (irritable bowel syndrome)    ? per pcp  . Obesity   . Rosacea    No past surgical history on file. Social History   Tobacco Use  . Smoking status: Former Smoker    Last attempt to quit: 12/30/2013    Years since quitting: 3.9  . Smokeless tobacco: Never Used  Substance Use Topics  . Alcohol use: Yes    Alcohol/week: 0.0 oz    Comment: occasionally   family history includes Diabetes in his father; Heart attack in his father; Lupus in his mother.  ROS as above:  Medications: Current Outpatient Medications  Medication Sig Dispense Refill  . AMBULATORY NON FORMULARY MEDICATION Single glucometer with lancets, test strips.  Test daily .   E11.65 1 each 0  . glucose blood (TRUE METRIX BLOOD GLUCOSE TEST) test strip TEST DAILY 100 each prn  . losartan (COZAAR) 25 MG tablet Take 1 tablet (25 mg total) by mouth daily. 90 tablet 1  . omeprazole (PRILOSEC) 40 MG capsule TAKE ONE CAPSULE BY MOUTH EVERY DAY AT LEAST 1 HOUR BEFORE A MEAL 30 capsule 11   No current facility-administered medications for this visit.    Allergies    Allergen Reactions  . Penicillins     As a child     Health Maintenance Health Maintenance  Topic Date Due  . TETANUS/TDAP  08/03/2022  . INFLUENZA VACCINE  Completed  . HIV Screening  Completed     Exam:  BP (!) 130/92   Pulse 89   Wt 229 lb (103.9 kg)   BMI 32.86 kg/m  Gen: Well NAD HEENT: EOMI,  MMM Lungs: Normal work of breathing. CTABL Heart: RRR no MRG Abd: NABS, Soft. Nondistended, Nontender Exts: Brisk capillary refill, warm and well perfused.  Diabetic foot exam: Normal-appearing with normal pulses capillary refill and sensation except for left third and fourth interdigital web space showing erythema with scaling consistent with tinea pedis    Results for orders placed or performed in visit on 12/23/17 (from the past 72 hour(s))  POCT HgB A1C     Status: None   Collection Time: 12/23/17  9:41 AM  Result Value Ref Range   Hemoglobin A1C 5.6    No results found.    Assessment and Plan: 32 y.o. male with  Prediabetes: Doing well on no medication.  Plan for watchful waiting and continue low carbohydrate diet.  Hypertension: A little bit elevated today.  Continue low-dose losartan will recheck in the near future.  Tinea pedis: Recommend topical Lamisil.  Recheck as needed.   Orders Placed This Encounter  Procedures  . POCT  HgB A1C   No orders of the defined types were placed in this encounter.    Discussed warning signs or symptoms. Please see discharge instructions. Patient expresses understanding.

## 2018-01-02 ENCOUNTER — Encounter: Payer: Self-pay | Admitting: Family Medicine

## 2018-01-05 MED ORDER — SILDENAFIL CITRATE 20 MG PO TABS: 60.0000 mg | ORAL_TABLET | ORAL | 11 refills | Status: DC | PRN

## 2018-01-05 NOTE — Addendum Note (Signed)
Addended by: Gregor Hams on: 01/05/2018 07:48 AM   Modules accepted: Orders

## 2018-01-21 ENCOUNTER — Encounter: Payer: Self-pay | Admitting: Family Medicine

## 2018-02-21 ENCOUNTER — Other Ambulatory Visit: Payer: Self-pay | Admitting: Family Medicine

## 2018-02-21 DIAGNOSIS — I10 Essential (primary) hypertension: Secondary | ICD-10-CM

## 2018-03-23 ENCOUNTER — Encounter: Payer: Self-pay | Admitting: Family Medicine

## 2018-05-20 ENCOUNTER — Other Ambulatory Visit: Payer: Self-pay | Admitting: Family Medicine

## 2018-05-20 DIAGNOSIS — K219 Gastro-esophageal reflux disease without esophagitis: Secondary | ICD-10-CM

## 2018-05-24 ENCOUNTER — Ambulatory Visit: Payer: 59 | Admitting: Family Medicine

## 2018-07-27 ENCOUNTER — Telehealth: Payer: Self-pay | Admitting: Family Medicine

## 2018-07-27 DIAGNOSIS — E119 Type 2 diabetes mellitus without complications: Secondary | ICD-10-CM

## 2018-07-27 DIAGNOSIS — I10 Essential (primary) hypertension: Secondary | ICD-10-CM

## 2018-07-27 NOTE — Telephone Encounter (Signed)
Pt called because he has an appointment with Dr.Corey on 8/13 for diabetes f/u on he states Dr.Corey asked him to call a few days before his appt so he can get a lab order. Please call patient and let him know if he goes to lab or here to get a lab order before his appt date. Thanks

## 2018-07-27 NOTE — Telephone Encounter (Signed)
Dr Georgina Snell out of office this week.  Dr T.- do you feel this is something you could order for pt?  Please advise. Thanks!

## 2018-07-27 NOTE — Telephone Encounter (Signed)
Labs ordered.

## 2018-07-27 NOTE — Telephone Encounter (Signed)
Patient has been informed. Saria Haran,CMA  

## 2018-07-29 ENCOUNTER — Ambulatory Visit: Payer: 59 | Admitting: Osteopathic Medicine

## 2018-08-03 ENCOUNTER — Ambulatory Visit (INDEPENDENT_AMBULATORY_CARE_PROVIDER_SITE_OTHER): Payer: 59 | Admitting: Family Medicine

## 2018-08-03 ENCOUNTER — Encounter: Payer: Self-pay | Admitting: Family Medicine

## 2018-08-03 VITALS — BP 153/89 | HR 85 | Ht 70.0 in | Wt 222.0 lb

## 2018-08-03 DIAGNOSIS — I10 Essential (primary) hypertension: Secondary | ICD-10-CM | POA: Diagnosis not present

## 2018-08-03 DIAGNOSIS — Z6831 Body mass index (BMI) 31.0-31.9, adult: Secondary | ICD-10-CM | POA: Diagnosis not present

## 2018-08-03 DIAGNOSIS — R7303 Prediabetes: Secondary | ICD-10-CM

## 2018-08-03 LAB — CBC
HCT: 46.1 % (ref 38.5–50.0)
Hemoglobin: 16.1 g/dL (ref 13.2–17.1)
MCH: 33.7 pg — ABNORMAL HIGH (ref 27.0–33.0)
MCHC: 34.9 g/dL (ref 32.0–36.0)
MCV: 96.4 fL (ref 80.0–100.0)
MPV: 11.8 fL (ref 7.5–12.5)
Platelets: 166 10*3/uL (ref 140–400)
RBC: 4.78 Million/uL (ref 4.20–5.80)
RDW: 12.1 % (ref 11.0–15.0)
WBC: 4.2 Thousand/uL (ref 3.8–10.8)

## 2018-08-03 LAB — COMPREHENSIVE METABOLIC PANEL WITH GFR
AST: 28 U/L (ref 10–40)
Alkaline phosphatase (APISO): 82 U/L (ref 40–115)
CO2: 27 mmol/L (ref 20–32)
Calcium: 9.4 mg/dL (ref 8.6–10.3)
Creat: 0.85 mg/dL (ref 0.60–1.35)
Globulin: 3 g/dL (ref 1.9–3.7)
Total Bilirubin: 0.6 mg/dL (ref 0.2–1.2)

## 2018-08-03 LAB — LIPID PANEL W/REFLEX DIRECT LDL
Cholesterol: 166 mg/dL (ref <200)
HDL: 30 mg/dL — ABNORMAL LOW (ref >40)
LDL Cholesterol (Calc): 106 mg/dL (calc) — ABNORMAL HIGH
Non-HDL Cholesterol (Calc): 136 mg/dL — ABNORMAL HIGH (ref <130)
Total CHOL/HDL Ratio: 5.5 (calc) — ABNORMAL HIGH (ref <5.0)
Triglycerides: 180 mg/dL — ABNORMAL HIGH (ref <150)

## 2018-08-03 LAB — HEMOGLOBIN A1C
Hgb A1c MFr Bld: 5.9 %{Hb} — ABNORMAL HIGH (ref <5.7)
Mean Plasma Glucose: 123 (calc)
eAG (mmol/L): 6.8 (calc)

## 2018-08-03 LAB — COMPREHENSIVE METABOLIC PANEL
AG Ratio: 1.4 (calc) (ref 1.0–2.5)
ALT: 57 U/L — ABNORMAL HIGH (ref 9–46)
Albumin: 4.3 g/dL (ref 3.6–5.1)
BUN: 13 mg/dL (ref 7–25)
Chloride: 103 mmol/L (ref 98–110)
Glucose, Bld: 124 mg/dL — ABNORMAL HIGH (ref 65–99)
Potassium: 4.6 mmol/L (ref 3.5–5.3)
Sodium: 137 mmol/L (ref 135–146)
Total Protein: 7.3 g/dL (ref 6.1–8.1)

## 2018-08-03 LAB — TSH: TSH: 0.92 m[IU]/L (ref 0.40–4.50)

## 2018-08-03 MED ORDER — TADALAFIL 20 MG PO TABS: 20.0000 mg | ORAL_TABLET | Freq: Every day | ORAL | 1 refills | Status: DC | PRN

## 2018-08-03 MED ORDER — LOSARTAN POTASSIUM 50 MG PO TABS: 50.0000 mg | ORAL_TABLET | Freq: Every day | ORAL | 1 refills | Status: DC

## 2018-08-03 NOTE — Patient Instructions (Signed)
Thank you for coming in today. Increase the losartan to 50mg  daily up from 25mg  daily.   Keep track of blood pressure at home using an over the counter automatic blood pressure cuff (amazon).  120s/70s is good.   Cilias replaces viagra when you close to running out.  It will cheaper at [facility] with GoodRx coupon printed.   Recheck with me in 6 months.   Return sooner if needed.

## 2018-08-03 NOTE — Progress Notes (Signed)
Bradley Diaz is a 32 y.o. male who presents to [facility]: [facility] today for follow-up prediabetes, obesity and hypertension.  Bradley Diaz has a history of diabetes with A1c of 10 on February 2018.  The interim he has continued a healthy lifestyle and has been controlling his diabetes with diet alone.  He exercises regularly and tries to eat a low carbohydrate diet.  He had a slip up and had weight gain and worsening diet a few months ago and over the last month is been improving his diet and exercise habits and notes normalized  for the blood sugars in the low 120s or less at home.  As well with no polyuria or polydipsia.  Obesity: As noted above 10 had some weight regain in the interim but over the last month or 2 has been working on a lower carbohydrate low calorie and higher exercise lifestyle.  He notes his weight is going back off and is feeling good how things are going.  Hypertension: Bradley Diaz takes losartan 25 mg daily.  He denies chest pain palpitations lightheadedness or dizziness significantly.  He does not check his blood pressure at home.  Erectile dysfunction: Patient takes sildenafil 100 mg daily.  He tolerates it well but is interested in switching to Cialis in the future if possible. ROS as above:  Exam:  BP (!) 153/89   Pulse 85   Ht 5\' 10"  (1.778 m)   Wt 222 lb (100.7 kg)   BMI 31.85 kg/m   Wt Readings from Last 5 Encounters:  08/03/18 222 lb (100.7 kg)  12/23/17 229 lb (103.9 kg)  08/20/17 225 lb (102.1 kg)  05/21/17 216 lb (98 kg)  03/04/17 223 lb (101.2 kg)    Gen: Well NAD HEENT: EOMI,  MMM Lungs: Normal work of breathing. CTABL Heart: RRR no MRG Abd: NABS, Soft. Nondistended, Nontender Exts: Brisk capillary refill, warm and well perfused.   Lab and Radiology Results Results for orders placed or performed in visit on 07/27/18 (from the past 72 hour(s))    CBC     Status: Abnormal   Collection Time: 08/02/18  7:43 AM  Result Value Ref Range   WBC 4.2 3.8 - 10.8 Thousand/uL   RBC 4.78 4.20 - 5.80 Million/uL   Hemoglobin 16.1 13.2 - 17.1 g/dL   HCT 46.1 38.5 - 50.0 %   MCV 96.4 80.0 - 100.0 fL   MCH 33.7 (H) 27.0 - 33.0 pg   MCHC 34.9 32.0 - 36.0 g/dL   RDW 12.1 11.0 - 15.0 %   Platelets 166 140 - 400 Thousand/uL   MPV 11.8 7.5 - 12.5 fL  Comprehensive metabolic panel     Status: Abnormal   Collection Time: 08/02/18  7:43 AM  Result Value Ref Range   Glucose, Bld 124 (H) 65 - 99 mg/dL    Comment: .            Fasting reference interval . For someone without known diabetes, a glucose value between 100 and 125 mg/dL is consistent with prediabetes and should be confirmed with a follow-up test. .    BUN 13 7 - 25 mg/dL   Creat 0.85 0.60 - 1.35 mg/dL   BUN/Creatinine Ratio NOT APPLICABLE 6 - 22 (calc)   Sodium 137 135 - 146 mmol/L   Potassium 4.6 3.5 - 5.3 mmol/L   Chloride 103 98 - 110 mmol/L   CO2 27 20 - 32 mmol/L  Calcium 9.4 8.6 - 10.3 mg/dL   Total Protein 7.3 6.1 - 8.1 g/dL   Albumin 4.3 3.6 - 5.1 g/dL   Globulin 3.0 1.9 - 3.7 g/dL (calc)   AG Ratio 1.4 1.0 - 2.5 (calc)   Total Bilirubin 0.6 0.2 - 1.2 mg/dL   Alkaline phosphatase (APISO) 82 40 - 115 U/L   AST 28 10 - 40 U/L   ALT 57 (H) 9 - 46 U/L  Hemoglobin A1c     Status: Abnormal   Collection Time: 08/02/18  7:43 AM  Result Value Ref Range   Hgb A1c MFr Bld 5.9 (H) <5.7 % of total Hgb    Comment: For someone without known diabetes, a hemoglobin  A1c value between 5.7% and 6.4% is consistent with prediabetes and should be confirmed with a  follow-up test. . For someone with known diabetes, a value <7% indicates that their diabetes is well controlled. A1c targets should be individualized based on duration of diabetes, age, comorbid conditions, and other considerations. . This assay result is consistent with an increased risk of diabetes. . Currently, no  consensus exists regarding use of hemoglobin A1c for diagnosis of diabetes for children. .    Mean Plasma Glucose 123 (calc)   eAG (mmol/L) 6.8 (calc)  Lipid Panel w/reflex Direct LDL     Status: Abnormal   Collection Time: 08/02/18  7:43 AM  Result Value Ref Range   Cholesterol 166 <200 mg/dL   HDL 30 (L) >40 mg/dL   Triglycerides 180 (H) <150 mg/dL   LDL Cholesterol (Calc) 106 (H) mg/dL (calc)    Comment: Reference range: <100 . Desirable range <100 mg/dL for primary prevention;   <70 mg/dL for patients with CHD or diabetic patients  with > or = 2 CHD risk factors. Marland Kitchen LDL-C is now calculated using the Martin-Hopkins  calculation, which is a validated novel method providing  better accuracy than the Friedewald equation in the  estimation of LDL-C.  Cresenciano Genre et al. Annamaria Helling. 4742;595(63): 2061-2068  (http://education.[employer].com/faq/FAQ164)    Total CHOL/HDL Ratio 5.5 (H) <5.0 (calc)   Non-HDL Cholesterol (Calc) 136 (H) <130 mg/dL (calc)    Comment: For patients with diabetes plus 1 major ASCVD risk  factor, treating to a non-HDL-C goal of <100 mg/dL  (LDL-C of <70 mg/dL) is considered a therapeutic  option.   TSH     Status: None   Collection Time: 08/02/18  7:43 AM  Result Value Ref Range   TSH 0.92 0.40 - 4.50 mIU/L   No results found.    Assessment and Plan: 32 y.o. male with  Prediabetes doing well A1c controlled continue healthy lifestyle recheck 6 months.  Hypertension: Not well controlled today.  Increase losartan to 50 mg daily.  Plan for home blood pressure log in about a month and recheck in clinic in 6 months or sooner.  Recheck labs at that time.  Obesity: Weight stabilized over the last 6 months but patient could stand to lose a bit more weight.  Continue healthy lifestyle including lower carbohydrate and calorie diet and increasing exercise.  Erectile dysfunction switch to Cialis at next fill.  30 mg of the 20 mg pills at [employer] using good  Rx should be less than $45.  No orders of the defined types were placed in this encounter.  Meds ordered this encounter  Medications  . tadalafil (CIALIS) 20 MG tablet    Sig: Take 1 tablet (20 mg total) by mouth daily as needed  for erectile dysfunction.    Dispense:  30 tablet    Refill:  1  . losartan (COZAAR) 50 MG tablet    Sig: Take 1 tablet (50 mg total) by mouth daily. For blood pressure    Dispense:  90 tablet    Refill:  1    Replaces losartan 25     Historical information moved to improve visibility of documentation.  Past Medical History:  Diagnosis Date  . Acid reflux   . Fatty liver   . IBS (irritable bowel syndrome)    ? per pcp  . Obesity   . Rosacea    History reviewed. No pertinent surgical history. Social History   Tobacco Use  . Smoking status: Former Smoker    Last attempt to quit: 12/30/2013    Years since quitting: 4.5  . Smokeless tobacco: Never Used  Substance Use Topics  . Alcohol use: Yes    Alcohol/week: 0.0 standard drinks    Comment: occasionally   family history includes Diabetes in his father; Heart attack in his father; Lupus in his mother.  Medications: Current Outpatient Medications  Medication Sig Dispense Refill  . AMBULATORY NON FORMULARY MEDICATION Single glucometer with lancets, test strips.  Test daily .   E11.65 1 each 0  . glucose blood (TRUE METRIX BLOOD GLUCOSE TEST) test strip TEST DAILY 100 each prn  . losartan (COZAAR) 50 MG tablet Take 1 tablet (50 mg total) by mouth daily. For blood pressure 90 tablet 1  . omeprazole (PRILOSEC) 40 MG capsule TAKE ONE CAPSULE BY MOUTH EVERY DAY AT LEAST 1 HOUR BEFORE A MEAL 30 capsule 11  . tadalafil (CIALIS) 20 MG tablet Take 1 tablet (20 mg total) by mouth daily as needed for erectile dysfunction. 30 tablet 1   No current facility-administered medications for this visit.    Allergies  Allergen Reactions  . Penicillins     As a child      Discussed warning signs or symptoms.  Please see discharge instructions. Patient expresses understanding.

## 2018-08-12 ENCOUNTER — Encounter: Payer: Self-pay | Admitting: Family Medicine

## 2018-08-15 ENCOUNTER — Other Ambulatory Visit: Payer: Self-pay | Admitting: Family Medicine

## 2018-08-15 DIAGNOSIS — I10 Essential (primary) hypertension: Secondary | ICD-10-CM

## 2018-08-31 LAB — HM DIABETES EYE EXAM

## 2018-09-09 ENCOUNTER — Encounter: Payer: Self-pay | Admitting: Family Medicine

## 2018-09-09 NOTE — Progress Notes (Signed)
Eye exam report received from ophthalmology.  No retinopathy.  Note will be sent to abstract.

## 2018-09-30 ENCOUNTER — Ambulatory Visit: Payer: 59 | Admitting: Family Medicine

## 2018-09-30 ENCOUNTER — Encounter: Payer: Self-pay | Admitting: Family Medicine

## 2018-09-30 VITALS — BP 137/81 | Temp 98.3°F | Ht 70.0 in | Wt 219.0 lb

## 2018-09-30 DIAGNOSIS — A389 Scarlet fever, uncomplicated: Secondary | ICD-10-CM | POA: Diagnosis not present

## 2018-09-30 DIAGNOSIS — J029 Acute pharyngitis, unspecified: Secondary | ICD-10-CM | POA: Diagnosis not present

## 2018-09-30 LAB — POCT RAPID STREP A (OFFICE): Rapid Strep A Screen: NEGATIVE

## 2018-09-30 MED ORDER — CEFDINIR 300 MG PO CAPS: 300.0000 mg | ORAL_CAPSULE | Freq: Two times a day (BID) | ORAL | 0 refills | Status: DC

## 2018-09-30 NOTE — Progress Notes (Signed)
Bradley Diaz is a 32 y.o. male who presents to [facility]: [place] today for sore throat and rash.  [name] notes that his child was recently diagnosed with strep throat.  He developed sore throat yesterday worsening today and developed a lacy red rash on his chest and today as well.  He notes mild subjective fevers and chills but denies significant cough congestion or runny nose.  He is tried some over-the-counter medications which helped a little.  No vomiting or diarrhea.   ROS as above:  Exam:  BP 137/81   Temp 98.3 F (36.8 C) (Oral)   Ht 5\' 10"  (1.778 m)   Wt 219 lb (99.3 kg)   SpO2 99%   BMI 31.42 kg/m  Wt Readings from Last 5 Encounters:  09/30/18 219 lb (99.3 kg)  08/03/18 222 lb (100.7 kg)  12/23/17 229 lb (103.9 kg)  08/20/17 225 lb (102.1 kg)  05/21/17 216 lb (98 kg)    Gen: Well NAD HEENT: EOMI,  MMM posterior pharynx shows tonsillar hypertrophy erythema with exudates bilaterally.  Mild cervical lymphadenopathy is present bilaterally. Lungs: Normal work of breathing. CTABL Heart: RRR no MRG Abd: NABS, Soft. Nondistended, Nontender Exts: Brisk capillary refill, warm and well perfused.  Skin: Erythematous small macular pinpoint lacy rash present on trunk and extremities.  Lab and Radiology Results Results for orders placed or performed in visit on 09/30/18 (from the past 72 hour(s))  POCT rapid strep A     Status: None   Collection Time: 09/30/18  2:43 PM  Result Value Ref Range   Rapid Strep A Screen Negative Negative   No results found.    Assessment and Plan: 32 y.o. male with pharyngitis with rash concerning for scarlet fever.  Patient likely does have strep throat.  I suspect the point-of-care rapid strep test is negative because of an inadequate swab.  Given his clinical history plan to treat empirically with Omnicef.  He has an allergy to  penicillins.  Recheck if not improving.  Return sooner if needed.   Orders Placed This Encounter  Procedures  . POCT rapid strep A   No orders of the defined types were placed in this encounter.    Historical information moved to improve visibility of documentation.  Past Medical History:  Diagnosis Date  . Acid reflux   . Fatty liver   . IBS (irritable bowel syndrome)    ? per pcp  . Obesity   . Rosacea    No past surgical history on file. Social History   Tobacco Use  . Smoking status: Former Smoker    Last attempt to quit: 12/30/2013    Years since quitting: 4.7  . Smokeless tobacco: Never Used  Substance Use Topics  . Alcohol use: Yes    Alcohol/week: 0.0 standard drinks    Comment: occasionally   family history includes Diabetes in his father; Heart attack in his father; Lupus in his mother.  Medications: Current Outpatient Medications  Medication Sig Dispense Refill  . AMBULATORY NON FORMULARY MEDICATION Single glucometer with lancets, test strips.  Test daily .   E11.65 1 each 0  . glucose blood (TRUE METRIX BLOOD GLUCOSE TEST) test strip TEST DAILY 100 each prn  . losartan (COZAAR) 25 MG tablet TAKE ONE TABLET BY MOUTH EVERY DAY 30 tablet 5  . losartan (COZAAR) 50 MG tablet Take 1 tablet (50 mg total) by mouth daily. For blood pressure 90 tablet  1  . omeprazole (PRILOSEC) 40 MG capsule TAKE ONE CAPSULE BY MOUTH EVERY DAY AT LEAST 1 HOUR BEFORE A MEAL 30 capsule 11  . tadalafil (CIALIS) 20 MG tablet Take 1 tablet (20 mg total) by mouth daily as needed for erectile dysfunction. 30 tablet 1   No current facility-administered medications for this visit.    Allergies  Allergen Reactions  . Penicillins     As a child      Discussed warning signs or symptoms. Please see discharge instructions. Patient expresses understanding.

## 2018-09-30 NOTE — Patient Instructions (Addendum)
Thank you for coming in today. Take omnicef twice daily for 1 week.  Take over the counter medicine like tylenol or ibuproifen as needed for fever, pain, body ache and sore head or headache.   Call or go to the emergency room if you get worse, have trouble breathing, have chest pains, or palpitations.   Get flu shot at nurse visit soon.

## 2018-10-04 ENCOUNTER — Telehealth: Payer: Self-pay

## 2018-10-04 NOTE — Telephone Encounter (Signed)
Patient has been informed. Tarrence Enck,CMA  

## 2018-10-04 NOTE — Telephone Encounter (Signed)
Patient called stated that he was seen for sore throat and a rash and was given antibiotic he stated that he has started the antibiotic but his rash has spreaded. Patient wants to know if its something he should be worried about or if he would need to come back in. Please advise. Rhonda Cunningham,CMA

## 2018-10-04 NOTE — Telephone Encounter (Signed)
As long as he is feeling well I think we can give it some time and watchful waiting.

## 2018-10-13 ENCOUNTER — Ambulatory Visit (INDEPENDENT_AMBULATORY_CARE_PROVIDER_SITE_OTHER): Payer: 59 | Admitting: Family Medicine

## 2018-10-13 DIAGNOSIS — Z23 Encounter for immunization: Secondary | ICD-10-CM | POA: Diagnosis not present

## 2018-10-13 MED ORDER — TADALAFIL 20 MG PO TABS: 20.0000 mg | ORAL_TABLET | Freq: Every day | ORAL | 12 refills | Status: DC | PRN

## 2018-10-29 ENCOUNTER — Encounter: Payer: Self-pay | Admitting: Physician Assistant

## 2018-10-29 ENCOUNTER — Ambulatory Visit: Payer: 59 | Admitting: Physician Assistant

## 2018-10-29 VITALS — BP 122/87 | HR 82 | Temp 98.2°F | Wt 228.0 lb

## 2018-10-29 DIAGNOSIS — J029 Acute pharyngitis, unspecified: Secondary | ICD-10-CM | POA: Diagnosis not present

## 2018-10-29 DIAGNOSIS — Z20818 Contact with and (suspected) exposure to other bacterial communicable diseases: Secondary | ICD-10-CM | POA: Diagnosis not present

## 2018-10-29 LAB — POCT RAPID STREP A (OFFICE): Rapid Strep A Screen: NEGATIVE

## 2018-10-29 MED ORDER — CEFDINIR 300 MG PO CAPS: 300.0000 mg | ORAL_CAPSULE | Freq: Two times a day (BID) | ORAL | 0 refills | Status: AC

## 2018-10-29 NOTE — Progress Notes (Signed)
HPI:                                                                Bradley Diaz is a 32 y.o. male who presents to [facility]: [place] today for sore throat  Sore Throat   This is a new problem. The current episode started yesterday. The problem has been unchanged. Neither side of throat is experiencing more pain than the other. There has been no fever. The pain is moderate. Pertinent negatives include no congestion, coughing, headaches or vomiting. Associated symptoms comments: - rash. He has had exposure to strep (son tested positive for GAS today). He has tried nothing for the symptoms.     Past Medical History:  Diagnosis Date  . Acid reflux   . Fatty liver   . IBS (irritable bowel syndrome)    ? per pcp  . Obesity   . Rosacea    No past surgical history on file. Social History   Tobacco Use  . Smoking status: Former Smoker    Last attempt to quit: 12/30/2013    Years since quitting: 4.8  . Smokeless tobacco: Never Used  Substance Use Topics  . Alcohol use: Yes    Alcohol/week: 0.0 standard drinks    Comment: occasionally   family history includes Diabetes in his father; Heart attack in his father; Lupus in his mother.    ROS: negative except as noted in the HPI  Medications: Current Outpatient Medications  Medication Sig Dispense Refill  . AMBULATORY NON FORMULARY MEDICATION Single glucometer with lancets, test strips.  Test daily .   E11.65 1 each 0  . glucose blood (TRUE METRIX BLOOD GLUCOSE TEST) test strip TEST DAILY 100 each prn  . losartan (COZAAR) 25 MG tablet TAKE ONE TABLET BY MOUTH EVERY DAY 30 tablet 5  . losartan (COZAAR) 50 MG tablet Take 1 tablet (50 mg total) by mouth daily. For blood pressure 90 tablet 1  . omeprazole (PRILOSEC) 40 MG capsule TAKE ONE CAPSULE BY MOUTH EVERY DAY AT LEAST 1 HOUR BEFORE A MEAL 30 capsule 11  . tadalafil (CIALIS) 20 MG tablet Take 1 tablet (20 mg total) by mouth daily as needed  for erectile dysfunction. 30 tablet 12   No current facility-administered medications for this visit.    Allergies  Allergen Reactions  . Penicillins     As a child        Objective:  BP 122/87   Pulse 82   Temp 98.2 F (36.8 C) (Oral)   Wt 228 lb (103.4 kg)   SpO2 96%   BMI 32.71 kg/m  Gen:  alert, not ill-appearing, no distress, appropriate for age 18: head normocephalic without obvious abnormality, conjunctiva and cornea clear, oropharynx with erythema, tonsils red without exudates, neck supple, no cervical adenopathy, trachea midline Pulm: Normal work of breathing, normal phonation Neuro: alert and oriented x 3, no tremor MSK: extremities atraumatic, normal gait and station Skin: intact, no rashes on exposed skin, no jaundice, no cyanosis    No results found for this or any previous visit (from the past 72 hour(s)). No results found.    Assessment and Plan: 32 y.o. male with   .Bradley Diaz was seen today for sore throat.  Diagnoses  and all orders for this visit:  Acute pharyngitis, unspecified etiology -     POCT rapid strep A -     Culture, Group A Strep; Future -     cefdinir (OMNICEF) 300 MG capsule; Take 1 capsule (300 mg total) by mouth 2 (two) times daily for 7 days.  Exposure to strep throat -     cefdinir (OMNICEF) 300 MG capsule; Take 1 capsule (300 mg total) by mouth 2 (two) times daily for 7 days.   Rapid GAS negative. However, I suspect there is a chance this a false negative. His son had a positive test today. He has no other URI symptoms. Throat culture pending Starting empiric treatment. Childhood allergy to PCN. Has tolerated Cephalosporins in the past   Patient education and anticipatory guidance given Patient agrees with treatment plan Follow-up as needed if symptoms worsen or fail to improve  Darlyne Russian PA-C

## 2018-10-31 LAB — CULTURE, GROUP A STREP
MICRO NUMBER:: 91348814
SPECIMEN QUALITY:: ADEQUATE

## 2019-02-01 ENCOUNTER — Telehealth: Payer: Self-pay | Admitting: Family Medicine

## 2019-02-01 DIAGNOSIS — K760 Fatty (change of) liver, not elsewhere classified: Secondary | ICD-10-CM

## 2019-02-01 DIAGNOSIS — R740 Nonspecific elevation of levels of transaminase and lactic acid dehydrogenase [LDH]: Secondary | ICD-10-CM

## 2019-02-01 DIAGNOSIS — R7303 Prediabetes: Secondary | ICD-10-CM

## 2019-02-01 DIAGNOSIS — R7401 Elevation of levels of liver transaminase levels: Secondary | ICD-10-CM

## 2019-02-01 DIAGNOSIS — Z6831 Body mass index (BMI) 31.0-31.9, adult: Secondary | ICD-10-CM

## 2019-02-01 DIAGNOSIS — I10 Essential (primary) hypertension: Secondary | ICD-10-CM

## 2019-02-01 NOTE — Telephone Encounter (Signed)
Patient would like to know if he needs to get labs done at the 6 month f/u for HTN,blood sugars. He canceled for this week but he will call back to reschedule. Please contact and advise.

## 2019-02-02 NOTE — Telephone Encounter (Signed)
Patient advised.

## 2019-02-02 NOTE — Telephone Encounter (Signed)
Labs ordered in preparation for upcoming visit.  Lab does not need to be fasting.

## 2019-02-03 ENCOUNTER — Ambulatory Visit: Payer: 59 | Admitting: Family Medicine

## 2019-02-17 ENCOUNTER — Other Ambulatory Visit: Payer: Self-pay | Admitting: Family Medicine

## 2019-02-17 DIAGNOSIS — I10 Essential (primary) hypertension: Secondary | ICD-10-CM

## 2019-04-08 ENCOUNTER — Ambulatory Visit (INDEPENDENT_AMBULATORY_CARE_PROVIDER_SITE_OTHER): Payer: 59

## 2019-04-08 ENCOUNTER — Encounter: Payer: Self-pay | Admitting: Family Medicine

## 2019-04-08 ENCOUNTER — Encounter: Payer: Self-pay | Admitting: Sports Medicine

## 2019-04-08 ENCOUNTER — Other Ambulatory Visit: Payer: Self-pay

## 2019-04-08 ENCOUNTER — Ambulatory Visit (INDEPENDENT_AMBULATORY_CARE_PROVIDER_SITE_OTHER): Payer: 59 | Admitting: Sports Medicine

## 2019-04-08 VITALS — BP 142/98 | HR 109 | Temp 97.8°F | Ht 70.0 in | Wt 225.0 lb

## 2019-04-08 DIAGNOSIS — R310 Gross hematuria: Secondary | ICD-10-CM | POA: Diagnosis not present

## 2019-04-08 DIAGNOSIS — R7303 Prediabetes: Secondary | ICD-10-CM | POA: Diagnosis not present

## 2019-04-08 DIAGNOSIS — R319 Hematuria, unspecified: Secondary | ICD-10-CM | POA: Diagnosis not present

## 2019-04-08 LAB — POCT URINALYSIS DIPSTICK
Bilirubin, UA: NEGATIVE
Glucose, UA: NEGATIVE
Ketones, UA: NEGATIVE
Leukocytes, UA: NEGATIVE
Nitrite, UA: NEGATIVE
Protein, UA: NEGATIVE
Spec Grav, UA: 1.025 (ref 1.010–1.025)
Urobilinogen, UA: 1 E.U./dL
pH, UA: 6 (ref 5.0–8.0)

## 2019-04-08 MED ORDER — CIPROFLOXACIN HCL 750 MG PO TABS: 750.0000 mg | ORAL_TABLET | Freq: Two times a day (BID) | ORAL | 0 refills | Status: DC

## 2019-04-08 NOTE — Assessment & Plan Note (Addendum)
Blood at the meatus, minimal burning with urination. Suspect prostatitis, we are going to start the investigation, CBC, CMP, coagulation factors, PSA. Renal ultrasound. Adding ciprofloxacin. Adding a urine culture. Return to see [place] in 2 weeks to reevaluate symptoms.

## 2019-04-08 NOTE — Telephone Encounter (Signed)
Left a message for patient to call back and schedule an appointment.

## 2019-04-08 NOTE — Assessment & Plan Note (Signed)
I am going to go ahead and get his routine labs.

## 2019-04-08 NOTE — Progress Notes (Signed)
Subjective:    CC: Blood in urine  HPI: Couple of days ago this pleasant 33 year old male noted a bit of blood after voiding.  He has noted a few more times yesterday and today.  There is minimal burning with urination, no flank pain, no fevers, chills, no trauma.  No constitutional symptoms.  Symptoms are moderate, persistent.  I reviewed the past medical history, family history, social history, surgical history, and allergies today and no changes were needed.  Please see the problem list section below in epic for further details.  Past Medical History: Past Medical History:  Diagnosis Date  . Acid reflux   . Fatty liver   . IBS (irritable bowel syndrome)    ? per pcp  . Obesity   . Rosacea    Past Surgical History: No past surgical history on file. Social History: Social History   Socioeconomic History  . Marital status: Married    Spouse name: Not on file  . Number of children: Not on file  . Years of education: Not on file  . Highest education level: Not on file  Occupational History  . Not on file  Social Needs  . Financial resource strain: Not on file  . Food insecurity:    Worry: Not on file    Inability: Not on file  . Transportation needs:    Medical: Not on file    Non-medical: Not on file  Tobacco Use  . Smoking status: Former Smoker    Last attempt to quit: 12/30/2013    Years since quitting: 5.2  . Smokeless tobacco: Never Used  Substance and Sexual Activity  . Alcohol use: Yes    Alcohol/week: 0.0 standard drinks    Comment: occasionally  . Drug use: No  . Sexual activity: Yes    Partners: Female  Lifestyle  . Physical activity:    Days per week: 7 days    Minutes per session: 30 min  . Stress: Not on file  Relationships  . Social connections:    Talks on phone: Not on file    Gets together: Not on file    Attends religious service: Not on file    Active member of club or organization: Not on file    Attends meetings of clubs or  organizations: Not on file    Relationship status: Not on file  Other Topics Concern  . Not on file  Social History Narrative  . Not on file   Family History: Family History  Problem Relation Age of Onset  . Heart attack Father   . Diabetes Father   . Lupus Mother    Allergies: Allergies  Allergen Reactions  . Penicillins Rash    As a child    Medications: See med rec.  Review of Systems: No fevers, chills, night sweats, weight loss, chest pain, or shortness of breath.   Objective:    General: Well Developed, well nourished, and in no acute distress.  Neuro: Alert and oriented x3, extra-ocular muscles intact, sensation grossly intact.  HEENT: Normocephalic, atraumatic, pupils equal round reactive to light, neck supple, no masses, no lymphadenopathy, thyroid nonpalpable.  Skin: Warm and dry, no rashes. Cardiac: Regular rate and rhythm, no murmurs rubs or gallops, no lower extremity edema.  Respiratory: Clear to auscultation bilaterally. Not using accessory muscles, speaking in full sentences. Abdomen: Soft, nontender, nondistended, normal bowel sounds, no guarding, rigidity, rebound pain, no costovertebral angle pain.  Urinalysis is positive for blood.  Impression and Recommendations:  Gross hematuria Blood at the meatus, minimal burning with urination. Suspect prostatitis, we are going to start the investigation, CBC, CMP, coagulation factors, PSA. Renal ultrasound. Adding ciprofloxacin. Adding a urine culture. Return to see [place] in 2 weeks to reevaluate symptoms.  Prediabetes I am going to go ahead and get his routine labs.   ___________________________________________ Gwen Her. Dianah Field, M.D., ABFM., CAQSM. Primary Care and Sports Medicine [facility]  Adjunct Professor of [place] [employer] [facility] [employer]

## 2019-04-08 NOTE — Telephone Encounter (Signed)
Patient scheduled an appointment.

## 2019-04-09 LAB — URINE CULTURE
MICRO NUMBER:: 404572
Result:: NO GROWTH
SPECIMEN QUALITY:: ADEQUATE

## 2019-04-11 LAB — COMPLETE METABOLIC PANEL WITH GFR
AG Ratio: 1.4 (calc) (ref 1.0–2.5)
ALT: 93 U/L — ABNORMAL HIGH (ref 9–46)
AST: 57 U/L — ABNORMAL HIGH (ref 10–40)
Albumin: 4.6 g/dL (ref 3.6–5.1)
Alkaline phosphatase (APISO): 100 U/L (ref 36–130)
BUN: 14 mg/dL (ref 7–25)
CO2: 27 mmol/L (ref 20–32)
Calcium: 10.5 mg/dL — ABNORMAL HIGH (ref 8.6–10.3)
Chloride: 102 mmol/L (ref 98–110)
Creat: 0.98 mg/dL (ref 0.60–1.35)
GFR, Est African American: 116 mL/min/{1.73_m2} (ref >=60)
GFR, Est Non African American: 100 mL/min/{1.73_m2} (ref >=60)
Globulin: 3.4 g/dL (calc) (ref 1.9–3.7)
Glucose, Bld: 198 mg/dL — ABNORMAL HIGH (ref 65–139)
Potassium: 4.8 mmol/L (ref 3.5–5.3)
Sodium: 138 mmol/L (ref 135–146)
Total Bilirubin: 0.6 mg/dL (ref 0.2–1.2)
Total Protein: 8 g/dL (ref 6.1–8.1)

## 2019-04-11 LAB — CBC
HCT: 49.3 % (ref 38.5–50.0)
Hemoglobin: 17.3 g/dL — ABNORMAL HIGH (ref 13.2–17.1)
MCH: 34.1 pg — ABNORMAL HIGH (ref 27.0–33.0)
MCHC: 35.1 g/dL (ref 32.0–36.0)
MCV: 97 fL (ref 80.0–100.0)
MPV: 11.9 fL (ref 7.5–12.5)
Platelets: 172 10*3/uL (ref 140–400)
RBC: 5.08 10*6/uL (ref 4.20–5.80)
RDW: 11.9 % (ref 11.0–15.0)
WBC: 4.6 10*3/uL (ref 3.8–10.8)

## 2019-04-11 LAB — PSA, TOTAL AND FREE
PSA, % Free: 50 % (calc) (ref >25)
PSA, Free: 0.1 ng/mL
PSA, Total: 0.2 ng/mL (ref <=4.0)

## 2019-04-11 LAB — TSH: TSH: 1.11 mIU/L (ref 0.40–4.50)

## 2019-04-11 LAB — VITAMIN D 25 HYDROXY (VIT D DEFICIENCY, FRACTURES): Vit D, 25-Hydroxy: 19 ng/mL — ABNORMAL LOW (ref 30–100)

## 2019-04-11 LAB — APTT: aPTT: 29 s (ref 22–34)

## 2019-04-11 LAB — LIPID PANEL W/REFLEX DIRECT LDL
Cholesterol: 184 mg/dL (ref <200)
HDL: 30 mg/dL — ABNORMAL LOW (ref >=40)
LDL Cholesterol (Calc): 106 mg/dL (calc) — ABNORMAL HIGH
Non-HDL Cholesterol (Calc): 154 mg/dL (calc) — ABNORMAL HIGH (ref <130)
Total CHOL/HDL Ratio: 6.1 (calc) — ABNORMAL HIGH (ref <5.0)
Triglycerides: 357 mg/dL — ABNORMAL HIGH (ref <150)

## 2019-04-11 LAB — HEMOGLOBIN A1C
Hgb A1c MFr Bld: 6.7 % of total Hgb — ABNORMAL HIGH (ref <5.7)
Mean Plasma Glucose: 146 (calc)
eAG (mmol/L): 8.1 (calc)

## 2019-04-11 LAB — PROTIME-INR
INR: 1
Prothrombin Time: 10.6 s (ref 9.0–11.5)

## 2019-04-22 ENCOUNTER — Encounter: Payer: Self-pay | Admitting: Sports Medicine

## 2019-04-22 ENCOUNTER — Ambulatory Visit (INDEPENDENT_AMBULATORY_CARE_PROVIDER_SITE_OTHER): Payer: 59 | Admitting: Sports Medicine

## 2019-04-22 VITALS — BP 120/84 | HR 102 | Ht 70.0 in | Wt 223.0 lb

## 2019-04-22 DIAGNOSIS — R319 Hematuria, unspecified: Secondary | ICD-10-CM

## 2019-04-22 DIAGNOSIS — R310 Gross hematuria: Secondary | ICD-10-CM | POA: Diagnosis not present

## 2019-04-22 LAB — POCT URINALYSIS DIPSTICK
Bilirubin, UA: NEGATIVE
Blood, UA: NEGATIVE
Glucose, UA: NEGATIVE
Ketones, UA: NEGATIVE
Leukocytes, UA: NEGATIVE
Nitrite, UA: NEGATIVE
Protein, UA: NEGATIVE
Spec Grav, UA: >=1.03 — AB (ref 1.010–1.025)
Urobilinogen, UA: 0.2 E.U./dL
pH, UA: 5.5 (ref 5.0–8.0)

## 2019-04-22 NOTE — Assessment & Plan Note (Signed)
No further episodes of hematuria, labs including PSA, urine culture were negative. We did a course of ciprofloxacin. Repeat urinalysis today is negative for blood, return as needed.

## 2019-04-22 NOTE — Progress Notes (Signed)
Subjective:    CC: Hematuria  HPI: Bradley Diaz returns, he had a bit of blood at the urethral meatus, this has since resolved, he is asymptomatic, happy with how things are going, here to retest to ensure clearance of hematuria.  I reviewed the past medical history, family history, social history, surgical history, and allergies today and no changes were needed.  Please see the problem list section below in epic for further details.  Past Medical History: Past Medical History:  Diagnosis Date  . Acid reflux   . Fatty liver   . IBS (irritable bowel syndrome)    ? per pcp  . Obesity   . Rosacea    Past Surgical History: No past surgical history on file. Social History: Social History   Socioeconomic History  . Marital status: Married    Spouse name: Not on file  . Number of children: Not on file  . Years of education: Not on file  . Highest education level: Not on file  Occupational History  . Not on file  Social Needs  . Financial resource strain: Not on file  . Food insecurity:    Worry: Not on file    Inability: Not on file  . Transportation needs:    Medical: Not on file    Non-medical: Not on file  Tobacco Use  . Smoking status: Former Smoker    Last attempt to quit: 12/30/2013    Years since quitting: 5.3  . Smokeless tobacco: Never Used  Substance and Sexual Activity  . Alcohol use: Yes    Alcohol/week: 0.0 standard drinks    Comment: occasionally  . Drug use: No  . Sexual activity: Yes    Partners: Female  Lifestyle  . Physical activity:    Days per week: 7 days    Minutes per session: 30 min  . Stress: Not on file  Relationships  . Social connections:    Talks on phone: Not on file    Gets together: Not on file    Attends religious service: Not on file    Active member of club or organization: Not on file    Attends meetings of clubs or organizations: Not on file    Relationship status: Not on file  Other Topics Concern  . Not on file  Social  History Narrative  . Not on file   Family History: Family History  Problem Relation Age of Onset  . Heart attack Father   . Diabetes Father   . Lupus Mother    Allergies: Allergies  Allergen Reactions  . Penicillins Rash    As a child    Medications: See med rec.  Review of Systems: No fevers, chills, night sweats, weight loss, chest pain, or shortness of breath.   Objective:    General: Well Developed, well nourished, and in no acute distress.  Neuro: Alert and oriented x3, extra-ocular muscles intact, sensation grossly intact.  HEENT: Normocephalic, atraumatic, pupils equal round reactive to light, neck supple, no masses, no lymphadenopathy, thyroid nonpalpable.  Skin: Warm and dry, no rashes. Cardiac: Regular rate and rhythm, no murmurs rubs or gallops, no lower extremity edema.  Respiratory: Clear to auscultation bilaterally. Not using accessory muscles, speaking in full sentences.  Urinalysis is clear.  Impression and Recommendations:    Gross hematuria No further episodes of hematuria, labs including PSA, urine culture were negative. We did a course of ciprofloxacin. Repeat urinalysis today is negative for blood, return as needed.   ___________________________________________ Gwen Her.  Dianah Field, M.D., ABFM., CAQSM. Primary Care and Sports Medicine [facility]  Adjunct Professor of [place] [employer] [facility] of Medicine

## 2019-05-08 ENCOUNTER — Other Ambulatory Visit: Payer: Self-pay | Admitting: Family Medicine

## 2019-05-08 DIAGNOSIS — K219 Gastro-esophageal reflux disease without esophagitis: Secondary | ICD-10-CM

## 2019-06-17 ENCOUNTER — Encounter: Payer: Self-pay | Admitting: Family Medicine

## 2019-06-17 ENCOUNTER — Other Ambulatory Visit: Payer: 59

## 2019-06-17 ENCOUNTER — Telehealth: Payer: Self-pay | Admitting: *Deleted

## 2019-06-17 ENCOUNTER — Telehealth: Payer: Self-pay | Admitting: Family Medicine

## 2019-06-17 ENCOUNTER — Telehealth: Payer: 59 | Admitting: Physician Assistant

## 2019-06-17 ENCOUNTER — Telehealth (INDEPENDENT_AMBULATORY_CARE_PROVIDER_SITE_OTHER): Payer: 59 | Admitting: Family Medicine

## 2019-06-17 VITALS — Temp 97.8°F

## 2019-06-17 DIAGNOSIS — Z20828 Contact with and (suspected) exposure to other viral communicable diseases: Secondary | ICD-10-CM

## 2019-06-17 DIAGNOSIS — Z20822 Contact with and (suspected) exposure to covid-19: Secondary | ICD-10-CM

## 2019-06-17 NOTE — Progress Notes (Signed)
Pt denies any sxs. But is a diabetic. He reports that his wife and child have also been exposed and that his wife has more sxs and has an appt w/Jade today. I advised him that he and his wife and son should all be quarantining from one another and that they should be wiping down surfaces.Maryruth Eve, [place] Crocker, CMA

## 2019-06-17 NOTE — Telephone Encounter (Signed)
Patient called and he states that his son was in contract with a child in a summer camp group that tested positive for COVID-19 and the son has been told to self quarantine for 14 days.  It has been 6 days since the girl was at summer camp.  The patient is concerned since he is diabetic if he will need testing. Patient reports wife is having chest pain and sore throat. Please advise.

## 2019-06-17 NOTE — Telephone Encounter (Signed)
-----   Message from Hali Marry, MD sent at 06/17/2019 11:56 AM EDT ----- Please call pt to schedule testing for today if possible.   Son with known positive expsoure through summer camp. Just round out yesterday. Wife with Chest pain and Sorethroat.    Beatrice Lecher MD

## 2019-06-17 NOTE — Telephone Encounter (Signed)
Patient had an appointment with Dr. Madilyn Fireman on 06/17/2019 as a virtual visit.

## 2019-06-17 NOTE — Progress Notes (Addendum)
Virtual Visit via Video Note  I connected with Bradley Diaz on 06/17/19 at  3:00 PM EDT by a video enabled telemedicine application and verified that I am speaking with the correct person using two identifiers.   I discussed the limitations of evaluation and management by telemedicine and the availability of in person appointments. The patient expressed understanding and agreed to proceed.  Pt was at home and I was in my office for the virtual visit.     Subjective:    CC: COVID exposure  HPI: 33 yo diabetic male is calling today bc of possible exposure to COVID.  He does work out in [employer] and not from home.  He is concerned because his son goes to a day camp and they just got a call yesterday that 1 of the children in his son's group tested positive for COVID and his son was around this child last week.  Son so far has been asymptomatic.  His risk of complications score is 4 which does put him at slightly higher risk if he develops COVID.  He denies any cough shortness of breath sore throat fever body aches or diarrhea.  Past medical history, Surgical history, Family history not pertinant except as noted below, Social history, Allergies, and medications have been entered into the medical record, reviewed, and corrections made.   Review of Systems: No fevers, chills, night sweats, weight loss, chest pain, or shortness of breath.   Objective:    General: Speaking clearly in complete sentences without any shortness of breath.  Alert and oriented x3.  Normal judgment. No apparent acute distress. Well groomed.     Impression and Recommendations:   Exposure to COVID-recommend that he be tested as he is higher risk.  His wife is already showing some symptoms with sore throat and chest discomfort and diarrhea and body aches.  So I also recommended that she get tested.  If over the weekend he develops any new symptoms then please let us know immediately if any increase shortness of breath  or work of breathing then please go the emergency department immediately.     I discussed the assessment and treatment plan with the patient. The patient was provided an opportunity to ask questions and all were answered. The patient agreed with the plan and demonstrated an understanding of the instructions.   The patient was advised to call back or seek an in-person evaluation if the symptoms worsen or if the condition fails to improve as anticipated.   Beatrice Lecher, MD

## 2019-06-17 NOTE — Telephone Encounter (Signed)
There is no right answer for this question so I am going to give you some options.  Unfortunately we do not have a medicine that I can give you that we will make COVID-19 less likely to make you very sick so knowing your status is not going to change your outcome at least for now.  I am happy to arrange for outpatient testing but a negative test does not mean that you will not get it sooner if someone in your household does have it and would not make it so that you can go out of the house more easily.  I think your wife should get tested and if she is positive then perhaps she should get tested but right now I do not think you should get tested but I am happy to arrange for outpatient testing if you really think that you want 1.  Bradley Diaz

## 2019-06-17 NOTE — Telephone Encounter (Signed)
Called patient and left a message for patient to call back.

## 2019-06-17 NOTE — Telephone Encounter (Signed)
Scheduled patient for today at 2:45 pm at [facility].  Testing protocol reviewed.

## 2019-06-23 ENCOUNTER — Other Ambulatory Visit: Payer: Self-pay | Admitting: Family Medicine

## 2019-06-23 DIAGNOSIS — K219 Gastro-esophageal reflux disease without esophagitis: Secondary | ICD-10-CM

## 2019-06-23 LAB — NOVEL CORONAVIRUS, NAA: SARS-CoV-2, NAA: NOT DETECTED

## 2019-07-15 ENCOUNTER — Telehealth (INDEPENDENT_AMBULATORY_CARE_PROVIDER_SITE_OTHER): Payer: 59 | Admitting: Osteopathic Medicine

## 2019-07-15 ENCOUNTER — Encounter: Payer: Self-pay | Admitting: Osteopathic Medicine

## 2019-07-15 ENCOUNTER — Other Ambulatory Visit: Payer: Self-pay

## 2019-07-15 VITALS — Temp 97.6°F | Wt 220.0 lb

## 2019-07-15 DIAGNOSIS — R0989 Other specified symptoms and signs involving the circulatory and respiratory systems: Secondary | ICD-10-CM | POA: Diagnosis not present

## 2019-07-15 DIAGNOSIS — Z20822 Contact with and (suspected) exposure to covid-19: Secondary | ICD-10-CM

## 2019-07-15 DIAGNOSIS — R6889 Other general symptoms and signs: Secondary | ICD-10-CM | POA: Diagnosis not present

## 2019-07-15 NOTE — Progress Notes (Signed)
Virtual Visit via Video (App used: MyChart) Note  I connected with      Joshau Diaz on 07/15/19 at 10:40 AM by a telemedicine application and verified that I am speaking with the correct person using two identifiers.  Patient is at home I am in office   I discussed the limitations of evaluation and management by telemedicine and the availability of in person appointments. The patient expressed understanding and agreed to proceed.  History of Present Illness: Bradley Diaz is a 33 y.o. male who would like to discuss COVID exposure  Wife exposed to COVID(+)person at work, was tested yesterday Today he has a scratchy throat.  Wife tested yesterday but no results back yet.  Pt has DM2 so he's concerned        Observations/Objective: Temp 97.6 F (36.4 C) (Oral)   Wt 220 lb (99.8 kg)   BMI 31.57 kg/m  BP Readings from Last 3 Encounters:  04/22/19 120/84  04/08/19 (!) 142/98  10/29/18 122/87   Exam: Normal Speech.  NAD  Lab and Radiology Results No results found for this or any previous visit (from the past 72 hour(s)). No results found.     Assessment and Plan: 33 y.o. male with There were no encounter diagnoses.   PDMP not reviewed this encounter. No orders of the defined types were placed in this encounter.  No orders of the defined types were placed in this encounter.  There are no Patient Instructions on file for this visit.  Instructions sent via MyChart. If MyChart not available, pt was given option for info via personal e-mail w/ no guarantee of protected health info over unsecured e-mail communication, and MyChart sign-up instructions were included.   Follow Up Instructions: No follow-ups on file.    I discussed the assessment and treatment plan with the patient. The patient was provided an opportunity to ask questions and all were answered. The patient agreed with the plan and demonstrated an understanding of the instructions.   The patient  was advised to call back or seek an in-person evaluation if any new concerns, if symptoms worsen or if the condition fails to improve as anticipated.  15 minutes of non-face-to-face time was provided during this encounter.                      Historical information moved to improve visibility of documentation.  Past Medical History:  Diagnosis Date  . Acid reflux   . Fatty liver   . IBS (irritable bowel syndrome)    ? per pcp  . Obesity   . Rosacea    No past surgical history on file. Social History   Tobacco Use  . Smoking status: Former Smoker    Quit date: 12/30/2013    Years since quitting: 5.5  . Smokeless tobacco: Never Used  Substance Use Topics  . Alcohol use: Yes    Alcohol/week: 0.0 standard drinks    Comment: occasionally   family history includes Diabetes in his father; Heart attack in his father; Lupus in his mother.  Medications: Current Outpatient Medications  Medication Sig Dispense Refill  . glucose blood (TRUE METRIX BLOOD GLUCOSE TEST) test strip TEST DAILY 100 each prn  . losartan (COZAAR) 25 MG tablet TAKE ONE TABLET BY MOUTH EVERY DAY 30 tablet 5  . omeprazole (PRILOSEC) 40 MG capsule Take 1 capsule (40 mg total) by mouth daily. Follow up with PCP before further refills 30 capsule 0  . tadalafil (  CIALIS) 20 MG tablet Take 1 tablet (20 mg total) by mouth daily as needed for erectile dysfunction. 30 tablet 12   No current facility-administered medications for this visit.    Allergies  Allergen Reactions  . Penicillins Rash    As a child     PDMP not reviewed this encounter. No orders of the defined types were placed in this encounter.  No orders of the defined types were placed in this encounter.

## 2019-07-15 NOTE — Patient Instructions (Signed)
  Based on Centers for Disease Control and Prevention (CDC) guidelines, he/she is required to stay home and self-monitor until the test results are returned.  1. If results are positive, the patient will need to stay at home and self-isolate until the follow conditions are met: a. at least 10 days since symptoms onset or date of positive test results b. AND 3 days fever free without antipyretics (Tylenol or Ibuprofen) c. AND improvement in respiratory symptoms.  2. If results are negative, and the patient is asymptomatic, they may resume normal activities.  3. If the results of the test are negative and the patient develops symptoms, they should self-isolate at home, self-monitor, and notify their primary care provider. a. They should continue to self-isolate at home and self-monitor until they have met the CDC "Non-Test Criteria for Ending Self-Isolation" which includes all of the following: i. at least 10 days since symptoms onset or date of positive test results ii. AND 3 days fever free without antipyretics (Tylenol or Ibuprofen) iii. AND improvement in respiratory symptoms.   These measures are being implemented nationally out of an abundance of caution given the expanding outbreak of COVID-19 and are consistent with current CDC recommendations.       

## 2019-07-19 LAB — NOVEL CORONAVIRUS, NAA: SARS-CoV-2, NAA: NOT DETECTED

## 2019-07-25 ENCOUNTER — Other Ambulatory Visit: Payer: Self-pay | Admitting: Family Medicine

## 2019-07-25 DIAGNOSIS — K219 Gastro-esophageal reflux disease without esophagitis: Secondary | ICD-10-CM

## 2019-08-14 ENCOUNTER — Other Ambulatory Visit: Payer: Self-pay | Admitting: Family Medicine

## 2019-08-14 DIAGNOSIS — I10 Essential (primary) hypertension: Secondary | ICD-10-CM

## 2019-08-22 ENCOUNTER — Other Ambulatory Visit: Payer: Self-pay | Admitting: Family Medicine

## 2019-08-22 DIAGNOSIS — K219 Gastro-esophageal reflux disease without esophagitis: Secondary | ICD-10-CM

## 2019-08-22 NOTE — Telephone Encounter (Signed)
MUST MAKE APPOINTMENT 

## 2019-08-30 ENCOUNTER — Encounter: Payer: Self-pay | Admitting: Family Medicine

## 2019-09-16 ENCOUNTER — Other Ambulatory Visit: Payer: Self-pay

## 2019-09-16 DIAGNOSIS — K219 Gastro-esophageal reflux disease without esophagitis: Secondary | ICD-10-CM

## 2019-09-16 MED ORDER — OMEPRAZOLE 40 MG PO CPDR: 40.0000 mg | DELAYED_RELEASE_CAPSULE | Freq: Every day | ORAL | 0 refills | Status: DC

## 2019-09-16 NOTE — Progress Notes (Signed)
Pt called for refill on omeprazole. Needs appt. Video visit scheduled for next week. Short term Rx sent.

## 2019-09-21 ENCOUNTER — Encounter: Payer: Self-pay | Admitting: Family Medicine

## 2019-09-21 ENCOUNTER — Telehealth (INDEPENDENT_AMBULATORY_CARE_PROVIDER_SITE_OTHER): Payer: 59 | Admitting: Family Medicine

## 2019-09-21 VITALS — BP 126/85 | HR 94 | Temp 97.2°F | Wt 219.0 lb

## 2019-09-21 DIAGNOSIS — K760 Fatty (change of) liver, not elsewhere classified: Secondary | ICD-10-CM

## 2019-09-21 DIAGNOSIS — I10 Essential (primary) hypertension: Secondary | ICD-10-CM

## 2019-09-21 DIAGNOSIS — E1159 Type 2 diabetes mellitus with other circulatory complications: Secondary | ICD-10-CM

## 2019-09-21 DIAGNOSIS — Z6831 Body mass index (BMI) 31.0-31.9, adult: Secondary | ICD-10-CM

## 2019-09-21 DIAGNOSIS — R718 Other abnormality of red blood cells: Secondary | ICD-10-CM

## 2019-09-21 DIAGNOSIS — K219 Gastro-esophageal reflux disease without esophagitis: Secondary | ICD-10-CM

## 2019-09-21 MED ORDER — OMEPRAZOLE 40 MG PO CPDR: 40.0000 mg | DELAYED_RELEASE_CAPSULE | Freq: Every day | ORAL | 3 refills | Status: DC

## 2019-09-21 MED ORDER — LOSARTAN POTASSIUM 25 MG PO TABS: 25.0000 mg | ORAL_TABLET | Freq: Every day | ORAL | 3 refills | Status: DC

## 2019-09-21 NOTE — Patient Instructions (Signed)
Thank you for coming in today.  Continue medicine.  Get fasting labs soon.  Schedule nurse visit for flu shot.   Keep carbs low and continue moderate exercise.  Recheck in 6 months with new PCP.   I will be moving to full time Sports Medicine in [place] starting on November 1st.  You will still be able to see me for your Sports Medicine or Orthopedic needs at [facility] in [place]. I will still be part of [place].    If you want to stay locally for your Sports Medicine issues Dr. Dianah Field here in [place] will be happy to see you.  Additionally Dr. Clearance Coots at [facility] will be happy to see you for sports medicine issues more locally.   For your primary care needs you are welcome to establish care with Dr. Emeterio Reeve.  We are working quickly to hire more physicians to cover the primary care needs however if you cannot get an appointment with Dr. Sheppard Coil in a timely manner [facility] has locations and openings for primary care services nearby.   [facility] at [facility] [address] . [place] , [place]: [phone] . Behavioral Medicine: [phone] . Fax: [place] at [employer] [address] . [place], [place]: [phone] . Behavioral Medicine: [phone] . Fax: [phone] . Hours (M-F): 7am - Academic librarian At [facility]. [place], [place]: [phone] . Behavioral Medicine: [phone] . Fax: [phone] . Hours (M-F): 8am - Optician, dispensing at [employer] . [place], [place] Phone: [phone] . Behavioral Medicine: [phone] . Fax: [phone]

## 2019-09-21 NOTE — Progress Notes (Signed)
Virtual Visit  via Video Note  I connected with      Bradley Diaz by a video enabled telemedicine application and verified that I am speaking with the correct person using two identifiers.   I discussed the limitations of evaluation and management by telemedicine and the availability of in person appointments. The patient expressed understanding and agreed to proceed.  History of Present Illness: Bradley Diaz is a 33 y.o. male who would like to discuss GERD hypertension diabetes.  Patient has a history of acid reflux manageable with omeprazole.  He has run out and notes that 20 mg omeprazole daily over-the-counter is not sufficient to control symptoms.  He would like to restart prescription 40 mg omeprazole.  He has a history of hypertension that is managed with 25 mg of losartan daily.  Tolerates medication well with no issues.  Patient was seen by my partner Dr. Dianah Field in April and had labs.  At that time A1c was elevated at 6.7.  Since then he has significantly changed his lifestyle.  He notes significant improve diet with low carbohydrates.  Additionally he is doing an exercise program daily.  He is lost some weight and is feeling a lot better.  He is checking his blood sugar regularly and notes his fasting blood sugars are usually 90-130.    Observations/Objective: BP 126/85   Pulse 94   Temp (!) 97.2 F (36.2 C)   Wt 219 lb (99.3 kg)   BMI 31.42 kg/m  Wt Readings from Last 5 Encounters:  09/21/19 219 lb (99.3 kg)  07/15/19 220 lb (99.8 kg)  04/22/19 223 lb (101.2 kg)  04/08/19 225 lb (102.1 kg)  10/29/18 228 lb (103.4 kg)   Exam: Appearance nontoxic appearing Normal Speech.     Assessment and Plan: 33 y.o. male with  Diabetes: Fasting sugars are much better controlled.  Patient has significantly adjusted lifestyle.  Plan to obtain fasting labs and A1c in near future.  If all is well recheck in 6 months.  Hypertension: Blood pressure reasonably controlled.   We will continue to improve with further lifestyle modification.  Continue losartan 25.  Recheck 6 months if all is well.  Get fasting labs as below.  GERD: Typically well managed with omeprazole 40.  Plan to restart omeprazole 40 mg daily and recheck as needed.  Patient will schedule nurse visit for flu vaccine in near future.  I informed patient that I am transitioning to sports medicine only [place] [facility] in [place] starting in November.  Happy to see patient for continued sports medicine needs.  Discussed need for new PCP.  Provided some recommendations.   PDMP not reviewed this encounter. Orders Placed This Encounter  Procedures  . CBC  . Hemoglobin A1c  . COMPLETE METABOLIC PANEL WITH GFR   Meds ordered this encounter  Medications  . omeprazole (PRILOSEC) 40 MG capsule    Sig: Take 1 capsule (40 mg total) by mouth daily.    Dispense:  90 capsule    Refill:  3  . losartan (COZAAR) 25 MG tablet    Sig: Take 1 tablet (25 mg total) by mouth daily.    Dispense:  90 tablet    Refill:  3    Follow Up Instructions:    I discussed the assessment and treatment plan with the patient. The patient was provided an opportunity to ask questions and all were answered. The patient agreed with the plan and demonstrated an understanding of the instructions.  The patient was advised to call back or seek an in-person evaluation if the symptoms worsen or if the condition fails to improve as anticipated.  Time: 15 minutes of intraservice time, with >22 minutes of total time during today's visit.      Historical information moved to improve visibility of documentation.  Past Medical History:  Diagnosis Date  . Acid reflux   . Fatty liver   . IBS (irritable bowel syndrome)    ? per pcp  . Obesity   . Rosacea    No past surgical history on file. Social History   Tobacco Use  . Smoking status: Former Smoker    Quit date: 12/30/2013    Years since quitting: 5.7  .  Smokeless tobacco: Never Used  Substance Use Topics  . Alcohol use: Yes    Alcohol/week: 0.0 standard drinks    Comment: occasionally   family history includes Diabetes in his father; Heart attack in his father; Lupus in his mother.  Medications: Current Outpatient Medications  Medication Sig Dispense Refill  . glucose blood (TRUE METRIX BLOOD GLUCOSE TEST) test strip TEST DAILY 100 each prn  . losartan (COZAAR) 25 MG tablet Take 1 tablet (25 mg total) by mouth daily. 90 tablet 3  . omeprazole (PRILOSEC) 40 MG capsule Take 1 capsule (40 mg total) by mouth daily. 90 capsule 3   No current facility-administered medications for this visit.    Allergies  Allergen Reactions  . Penicillins Rash    As a child

## 2020-01-16 ENCOUNTER — Ambulatory Visit (INDEPENDENT_AMBULATORY_CARE_PROVIDER_SITE_OTHER): Payer: 59 | Admitting: Physician Assistant

## 2020-01-16 ENCOUNTER — Encounter: Payer: Self-pay | Admitting: Physician Assistant

## 2020-01-16 VITALS — Temp 96.1°F | Ht 70.0 in | Wt 219.0 lb

## 2020-01-16 DIAGNOSIS — Z20822 Contact with and (suspected) exposure to covid-19: Secondary | ICD-10-CM | POA: Diagnosis not present

## 2020-01-16 NOTE — Addendum Note (Signed)
Addended byAnnamaria Helling on: 01/16/2020 03:54 PM   Modules accepted: Orders

## 2020-01-16 NOTE — Progress Notes (Addendum)
Patient ID: Bradley Diaz, male   DOB: 1986-12-21, 34 y.o.   MRN: [id number]  .Marland KitchenVirtual Visit via Video Note  I connected with Bradley Diaz on 01/16/20 at  2:20 PM EST by a video enabled telemedicine application and verified that I am speaking with the correct person using two identifiers.  Location: Patient: home Provider: clinic   I discussed the limitations of evaluation and management by telemedicine and the availability of in person appointments. The patient expressed understanding and agreed to proceed.  History of Present Illness: Pt is a 34 yo obese male who calls into the clinic with runny nose and nasal congestion that started yesterday. His son tested positive for covid today. He denies any fever, chills, headaches, body aches, SOB, cough, GI side effects or loss of smell or taste. He has not taken anything for symptoms.  .. Active Ambulatory Problems    Diagnosis Date Noted  . Essential hypertension 11/29/2014  . Esophageal reflux 11/29/2014  . Hypertension associated with diabetes ([place]) 12/18/2014  . Nonalcoholic hepatosteatosis [id number]  . Tinea pedis, left 12/23/2017  . BMI 31.0-31.9,adult 08/03/2018  . Gross hematuria 04/08/2019   Resolved Ambulatory Problems    Diagnosis Date Noted  . Annual physical exam 11/29/2014  . Elevated LFTs 12/13/2014  . Allergic conjunctivitis 07/16/2015  . Diarrhea 11/14/2015  . Uncontrolled diabetes mellitus ([place]) 01/27/2017  . Diabetic nephropathy ([place]) 02/04/2017   Past Medical History:  Diagnosis Date  . Acid reflux   . Fatty liver   . IBS (irritable bowel syndrome)   . Obesity   . Rosacea    Reviewed med, allergy, problem list.      Observations/Objective: No acute distress.  Normal mood and appearance.  No breathing difficultly.   .. Today's Vitals   01/16/20 1324  Temp: (!) 96.1 F (35.6 C)  TempSrc: Temporal  Weight: 219 lb (99.3 kg)  Height: 5\' 10"  (1.778 m)   Body mass index is 31.42  kg/m.   Assessment and Plan: Marland KitchenMarland KitchenCanton was seen today for nasal congestion.  Diagnoses and all orders for this visit:  Suspected COVID-19 virus infection  Close exposure to COVID-19 virus   Pt has positive home exposure with son and he has active covid symptoms. Will treat as he has positive infection. Pt was alerted of 10 day quarantine. He may return to work on 01/26/20. Note in EMR. Discussed covid symptoms that could occur. Discussed symptomatic care with ibuprofen, rest, hydration, vitamin C, zinc. If any SOB or breathing alert [place] or go to UC/ED. COVID complication risk is moderate. Follow up as needed.   Spent 13 minutes with patient care.    Follow Up Instructions:    I discussed the assessment and treatment plan with the patient. The patient was provided an opportunity to ask questions and all were answered. The patient agreed with the plan and demonstrated an understanding of the instructions.   The patient was advised to call back or seek an in-person evaluation if the symptoms worsen or if the condition fails to improve as anticipated.    Iran Planas, PA-C

## 2020-01-16 NOTE — Progress Notes (Signed)
Son diagnosed with Covid diagnosed today (lives in home/ had fever and other symptoms all weekend) Runny nose - started yesterday  No other symptoms

## 2020-01-18 LAB — NOVEL CORONAVIRUS, NAA: SARS-CoV-2, NAA: NOT DETECTED

## 2020-01-18 LAB — SPECIMEN STATUS REPORT

## 2020-01-18 NOTE — Progress Notes (Signed)
Negative for covid

## 2020-05-23 ENCOUNTER — Ambulatory Visit: Payer: 59 | Admitting: Family Medicine

## 2020-07-02 ENCOUNTER — Other Ambulatory Visit: Payer: Self-pay

## 2020-07-02 ENCOUNTER — Encounter: Payer: Self-pay | Admitting: Family Medicine

## 2020-07-02 ENCOUNTER — Ambulatory Visit (INDEPENDENT_AMBULATORY_CARE_PROVIDER_SITE_OTHER): Payer: 59 | Admitting: Family Medicine

## 2020-07-02 VITALS — BP 133/82 | HR 100 | Temp 97.9°F | Ht 70.08 in | Wt 215.1 lb

## 2020-07-02 DIAGNOSIS — E1159 Type 2 diabetes mellitus with other circulatory complications: Secondary | ICD-10-CM | POA: Diagnosis not present

## 2020-07-02 DIAGNOSIS — Z13220 Encounter for screening for lipoid disorders: Secondary | ICD-10-CM | POA: Diagnosis not present

## 2020-07-02 DIAGNOSIS — W57XXXA Bitten or stung by nonvenomous insect and other nonvenomous arthropods, initial encounter: Secondary | ICD-10-CM

## 2020-07-02 DIAGNOSIS — I10 Essential (primary) hypertension: Secondary | ICD-10-CM

## 2020-07-02 DIAGNOSIS — K219 Gastro-esophageal reflux disease without esophagitis: Secondary | ICD-10-CM

## 2020-07-02 DIAGNOSIS — I152 Hypertension secondary to endocrine disorders: Secondary | ICD-10-CM

## 2020-07-02 MED ORDER — TRIAMCINOLONE ACETONIDE 0.1 % EX CREA: 1.0000 "application " | TOPICAL_CREAM | Freq: Two times a day (BID) | CUTANEOUS | 0 refills | Status: AC

## 2020-07-02 MED ORDER — OMEPRAZOLE 40 MG PO CPDR: 40.0000 mg | DELAYED_RELEASE_CAPSULE | Freq: Every day | ORAL | 3 refills | Status: AC

## 2020-07-02 MED ORDER — LOSARTAN POTASSIUM 25 MG PO TABS: 25.0000 mg | ORAL_TABLET | Freq: Every day | ORAL | 3 refills | Status: AC

## 2020-07-02 NOTE — Patient Instructions (Addendum)
Nice to meet you! Please have labs completed.  We'll be in touch with results.  Try triamcinolone cream as needed.  See me again in about 6 months.

## 2020-07-02 NOTE — Progress Notes (Signed)
Bradley Diaz - 34 y.o. male MRN [id number]  Date of birth: Oct 23, 1986  Subjective No chief complaint on file.   HPI Bradley Diaz is a 34 y.o. male with history of HTN and T2DM here today for follow up visit.  He also has concerns about tick bites.   -T2DM:  Diabetes has been well managed with diet alone.  He has started exercising more frequently.  Weight is down about 4lbs since January.  Readings at home range from 90-125.   -HTN:  Current management with Losartan.  He is tolerating this well and denies symptoms related to HTN inlcuding chest pain, shortness of breath, palpitations, headache or vision changes.   -Tick bites:  Tick bites from about 3 months ago.  Reports continued itching around sites.  Denies pain, rash, fever, chills, joint aches or headaches.  ROS:  A comprehensive ROS was completed and negative except as noted per HPI     Allergies  Allergen Reactions  . Penicillins Rash    As a child     Past Medical History:  Diagnosis Date  . Acid reflux   . Fatty liver   . IBS (irritable bowel syndrome)    ? per pcp  . Obesity   . Rosacea     History reviewed. No pertinent surgical history.  Social History   Socioeconomic History  . Marital status: Married    Spouse name: Not on file  . Number of children: Not on file  . Years of education: Not on file  . Highest education level: Not on file  Occupational History  . Not on file  Tobacco Use  . Smoking status: Former Smoker    Quit date: 12/30/2013    Years since quitting: 6.5  . Smokeless tobacco: Never Used  Substance and Sexual Activity  . Alcohol use: Yes    Alcohol/week: 0.0 standard drinks    Comment: occasionally  . Drug use: No  . Sexual activity: Yes    Partners: Female  Other Topics Concern  . Not on file  Social History Narrative  . Not on file   Social Determinants of Health   Financial Resource Strain:   . Difficulty of Paying Living Expenses:   Food Insecurity:   . Worried About  Charity fundraiser in the Last Year:   . Arboriculturist in the Last Year:   Transportation Needs:   . Film/video editor (Medical):   Marland Kitchen Lack of Transportation (Non-Medical):   Physical Activity:   . Days of Exercise per Week:   . Minutes of Exercise per Session:   Stress:   . Feeling of Stress :   Social Connections:   . Frequency of Communication with Friends and Family:   . Frequency of Social Gatherings with Friends and Family:   . Attends Religious Services:   . Active Member of Clubs or Organizations:   . Attends Archivist Meetings:   Marland Kitchen Marital Status:     Family History  Problem Relation Age of Onset  . Heart attack Father   . Diabetes Father   . Lupus Mother     Health Maintenance  Topic Date Due  . FOOT EXAM  12/23/2018  . OPHTHALMOLOGY EXAM  09/01/2019  . HEMOGLOBIN A1C  10/08/2019  . INFLUENZA VACCINE  07/22/2020  . TETANUS/TDAP  08/03/2022  . PNEUMOCOCCAL POLYSACCHARIDE VACCINE AGE 73-64 HIGH RISK  Completed  . COVID-19 Vaccine  Completed  . Hepatitis C Screening  Completed  .  HIV Screening  Completed     ----------------------------------------------------------------------------------------------------------------------------------------------------------------------------------------------------------------- Physical Exam BP 133/82 (BP Location: Left Arm, Patient Position: Sitting, Cuff Size: Large)   Pulse 100   Temp 97.9 F (36.6 C)   Ht 5' 10.08" (1.78 m)   Wt 215 lb 1.6 oz (97.6 kg)   SpO2 99%   BMI 30.79 kg/m   Physical Exam Constitutional:      Appearance: Normal appearance.  Eyes:     General: No scleral icterus. Cardiovascular:     Rate and Rhythm: Normal rate and regular rhythm.  Pulmonary:     Effort: Pulmonary effort is normal.     Breath sounds: Normal breath sounds.  Skin:    Comments: Small erythematous papule along waistline at side of previous tick bite.   Neurological:     General: No focal deficit  present.     Mental Status: He is alert.  Psychiatric:        Mood and Affect: Mood normal.        Behavior: Behavior normal.     ------------------------------------------------------------------------------------------------------------------------------------------------------------------------------------------------------------------- Assessment and Plan  Essential hypertension Blood pressure is at goal at for age and co-morbidities.  I recommend continuation of losartan.  In addition they were instructed to follow a low sodium diet with regular exercise to help to maintain adequate control of blood pressure.    Hypertension associated with diabetes ([place]) Diabetes has been well controlled with diet.  Update a1c and additional labs today.  Encouraged continued exercise.   Tick bite Still having some itching at site of bite.   No other signs or symptoms related to tick borne illness.  Rx for triamcinolone as needed for itching.    Meds ordered this encounter  Medications  . triamcinolone cream (KENALOG) 0.1 %    Sig: Apply 1 application topically 2 (two) times daily.    Dispense:  30 g    Refill:  0  . losartan (COZAAR) 25 MG tablet    Sig: Take 1 tablet (25 mg total) by mouth daily.    Dispense:  90 tablet    Refill:  3  . omeprazole (PRILOSEC) 40 MG capsule    Sig: Take 1 capsule (40 mg total) by mouth daily.    Dispense:  90 capsule    Refill:  3    Return in about 6 months (around 01/02/2021) for DM/HTN.    This visit occurred during the SARS-CoV-2 public health emergency.  Safety protocols were in place, including screening questions prior to the visit, additional usage of staff PPE, and extensive cleaning of exam room while observing appropriate contact time as indicated for disinfecting solutions.

## 2020-07-02 NOTE — Assessment & Plan Note (Signed)
Diabetes has been well controlled with diet.  Update a1c and additional labs today.  Encouraged continued exercise.

## 2020-07-02 NOTE — Assessment & Plan Note (Signed)
Blood pressure is at goal at for age and co-morbidities.  I recommend continuation of losartan.  In addition they were instructed to follow a low sodium diet with regular exercise to help to maintain adequate control of blood pressure.   

## 2020-07-02 NOTE — Assessment & Plan Note (Signed)
Still having some itching at site of bite.   No other signs or symptoms related to tick borne illness.  Rx for triamcinolone as needed for itching.

## 2020-07-04 LAB — COMPLETE METABOLIC PANEL WITH GFR
AG Ratio: 1.3 (calc) (ref 1.0–2.5)
ALT: 33 U/L (ref 9–46)
AST: 20 U/L (ref 10–40)
Albumin: 4.2 g/dL (ref 3.6–5.1)
Alkaline phosphatase (APISO): 69 U/L (ref 36–130)
BUN: 14 mg/dL (ref 7–25)
CO2: 28 mmol/L (ref 20–32)
Calcium: 9.6 mg/dL (ref 8.6–10.3)
Chloride: 100 mmol/L (ref 98–110)
Creat: 0.83 mg/dL (ref 0.60–1.35)
GFR, Est African American: 132 mL/min/{1.73_m2} (ref >=60)
GFR, Est Non African American: 114 mL/min/{1.73_m2} (ref >=60)
Globulin: 3.2 g/dL (calc) (ref 1.9–3.7)
Glucose, Bld: 132 mg/dL — ABNORMAL HIGH (ref 65–99)
Potassium: 4.8 mmol/L (ref 3.5–5.3)
Sodium: 137 mmol/L (ref 135–146)
Total Bilirubin: 0.6 mg/dL (ref 0.2–1.2)
Total Protein: 7.4 g/dL (ref 6.1–8.1)

## 2020-07-04 LAB — LIPID PANEL
Cholesterol: 181 mg/dL (ref <200)
HDL: 30 mg/dL — ABNORMAL LOW (ref >=40)
LDL Cholesterol (Calc): 111 mg/dL (calc) — ABNORMAL HIGH
Non-HDL Cholesterol (Calc): 151 mg/dL (calc) — ABNORMAL HIGH (ref <130)
Total CHOL/HDL Ratio: 6 (calc) — ABNORMAL HIGH (ref <5.0)
Triglycerides: 297 mg/dL — ABNORMAL HIGH (ref <150)

## 2020-07-04 LAB — CBC
HCT: 47.2 % (ref 38.5–50.0)
Hemoglobin: 16.4 g/dL (ref 13.2–17.1)
MCH: 34.2 pg — ABNORMAL HIGH (ref 27.0–33.0)
MCHC: 34.7 g/dL (ref 32.0–36.0)
MCV: 98.3 fL (ref 80.0–100.0)
MPV: 11.7 fL (ref 7.5–12.5)
Platelets: 142 10*3/uL (ref 140–400)
RBC: 4.8 10*6/uL (ref 4.20–5.80)
RDW: 12.1 % (ref 11.0–15.0)
WBC: 4.8 10*3/uL (ref 3.8–10.8)

## 2020-07-04 LAB — HEMOGLOBIN A1C
Hgb A1c MFr Bld: 5.7 % of total Hgb — ABNORMAL HIGH (ref <5.7)
Mean Plasma Glucose: 117 (calc)
eAG (mmol/L): 6.5 (calc)

## 2020-07-05 ENCOUNTER — Other Ambulatory Visit: Payer: Self-pay

## 2020-07-05 DIAGNOSIS — E785 Hyperlipidemia, unspecified: Secondary | ICD-10-CM

## 2020-07-06 MED ORDER — ATORVASTATIN CALCIUM 20 MG PO TABS: 20.0000 mg | ORAL_TABLET | Freq: Every day | ORAL | 1 refills | Status: AC

## 2020-07-06 NOTE — Progress Notes (Signed)
rx sent

## 2020-09-02 IMAGING — US US RENAL
1 series · 14 of 25 positions shown · non-contrast
Comparison: None.

CLINICAL DATA: Hematuria.

EXAM:
RENAL / URINARY TRACT ULTRASOUND COMPLETE

[Series 1: us renal · 0.27mm/px · 14 of 44 slices shown]
[im 1/44]
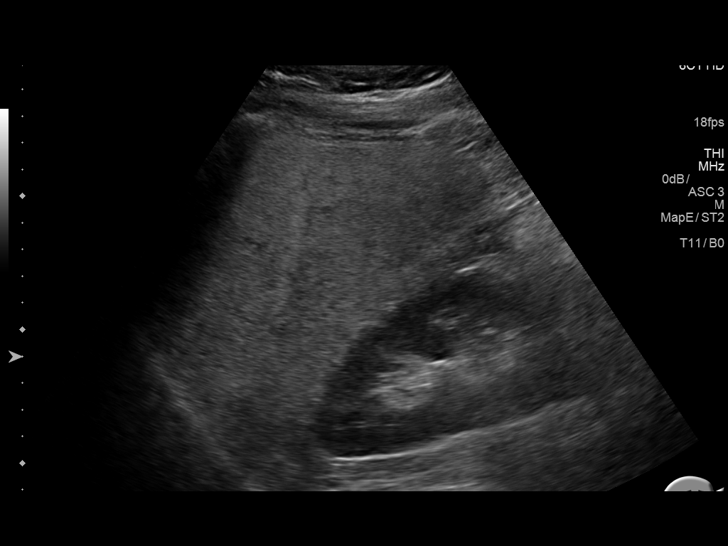
[im 4/44]
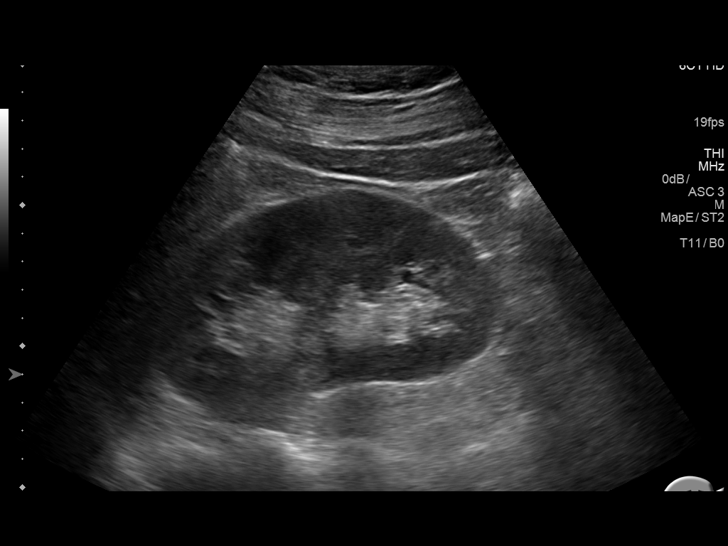
[im 8/44]
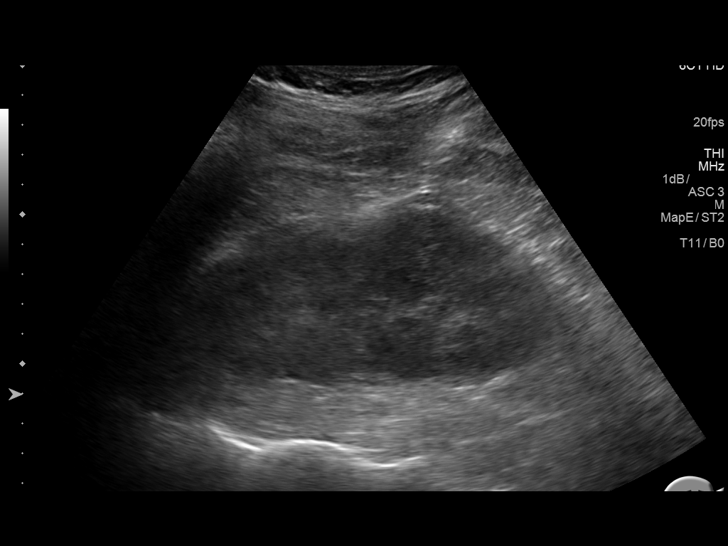
[im 11/44]
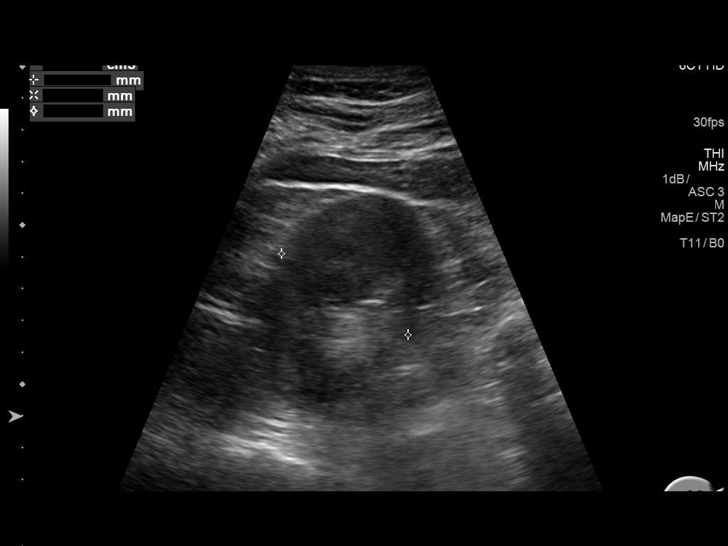
[im 15/44]
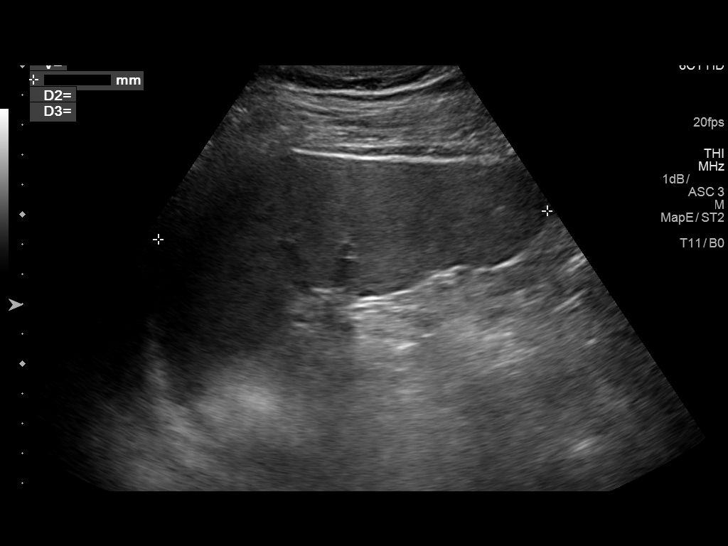
[im 17/44]
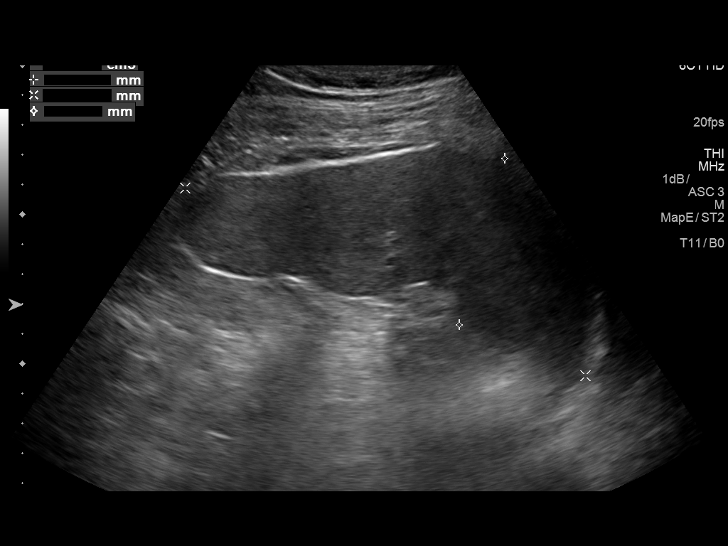
[im 20/44]
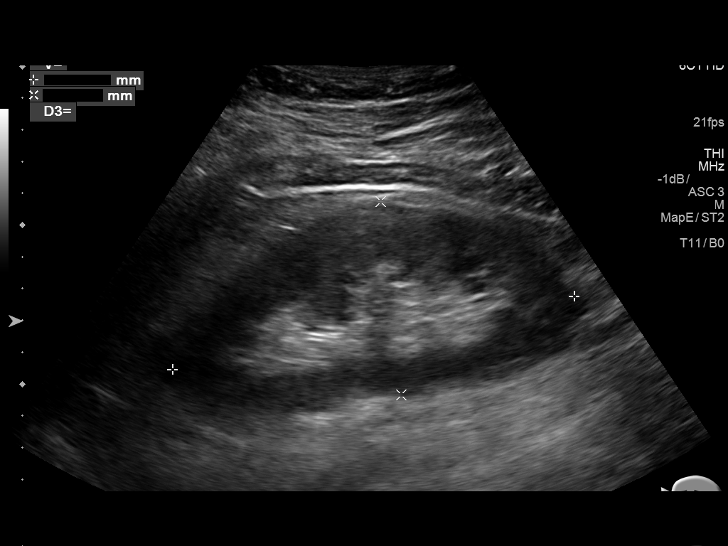
[im 24/44]
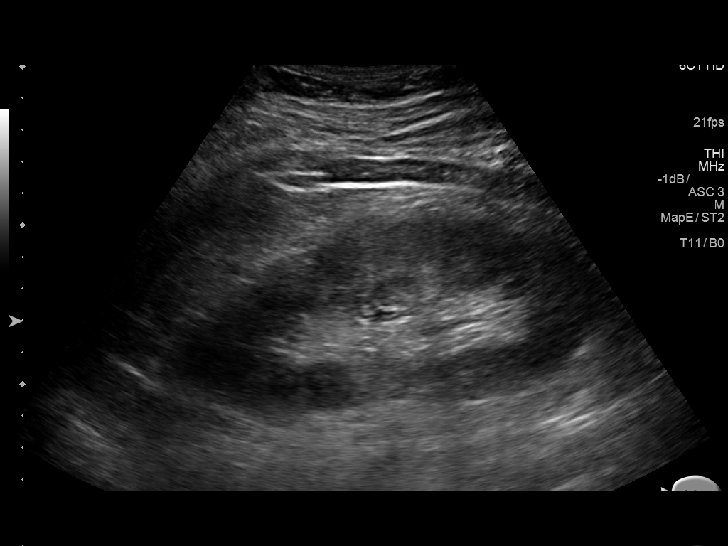
[im 27/44]
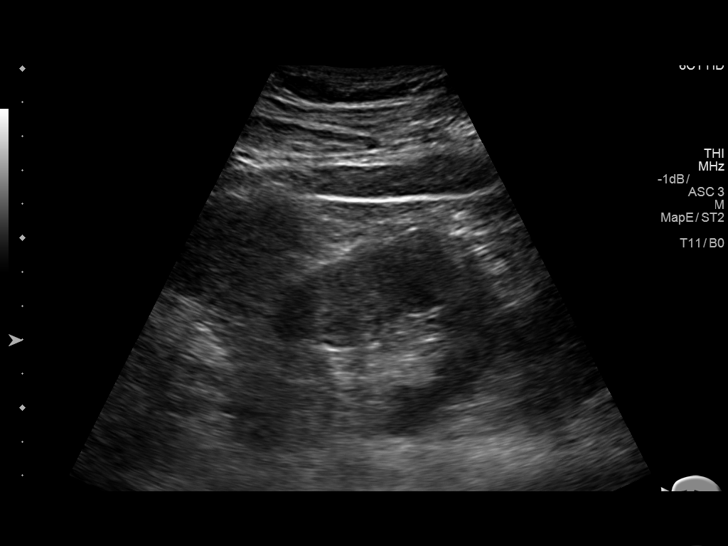
[im 29/44]
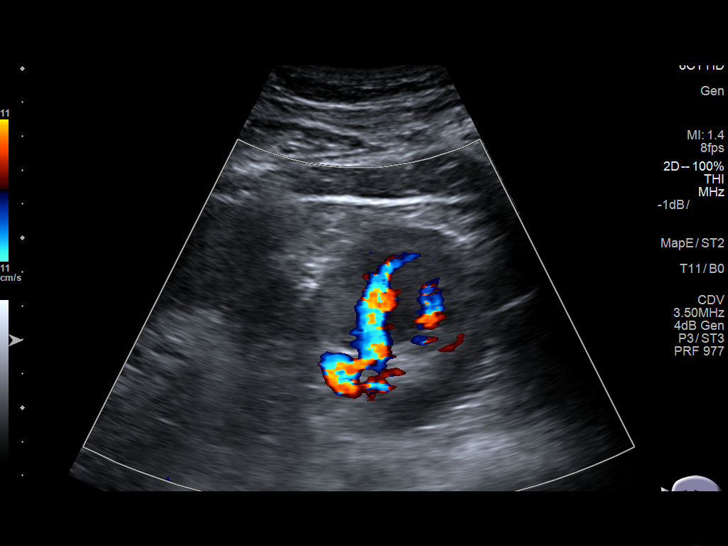
[im 33/44]
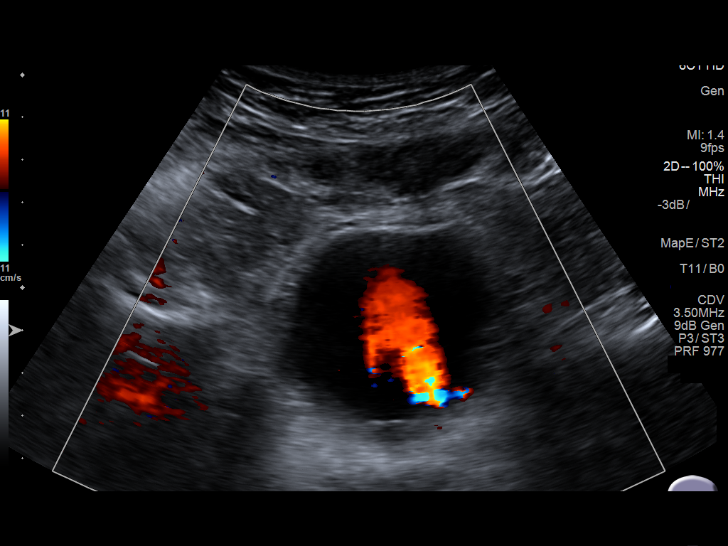
[im 36/44]
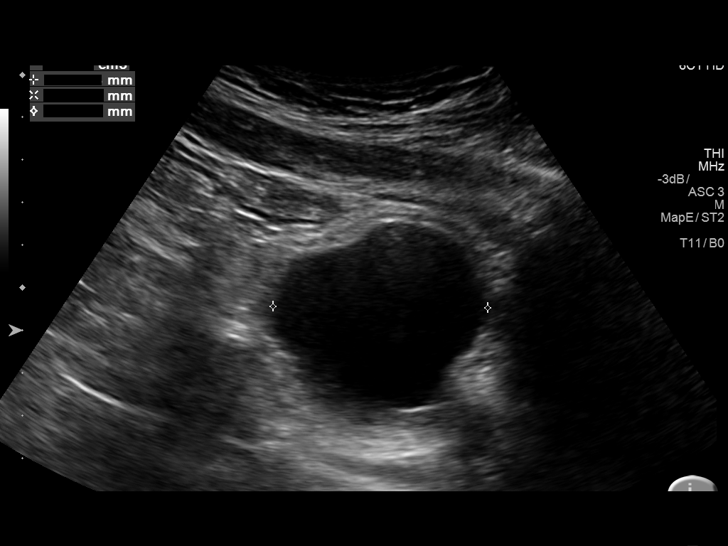
[im 40/44]
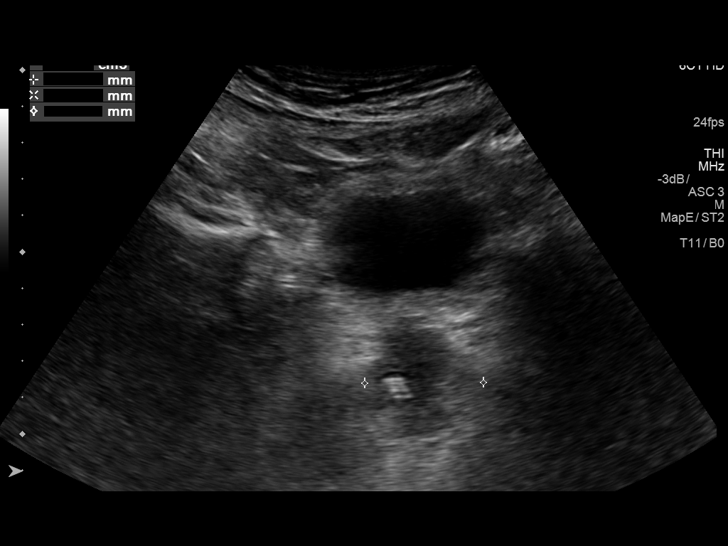
[im 44/44]
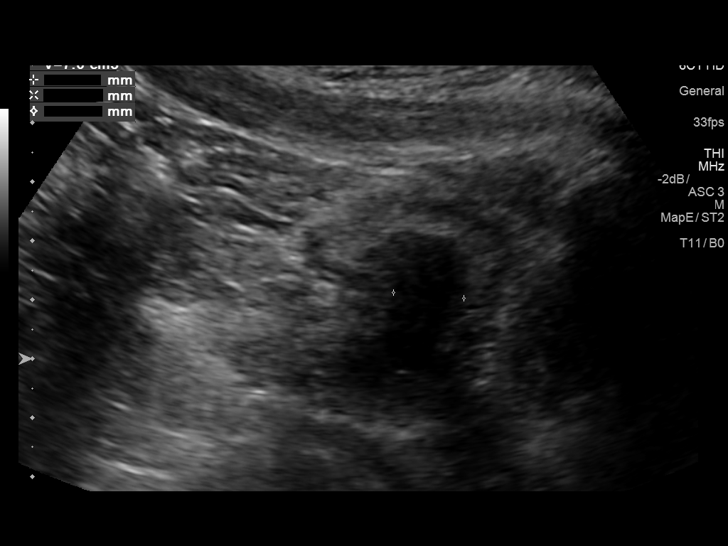

[14 of 25 positions shown; findings below may reference images not displayed]

FINDINGS: Right Kidney:

Renal measurements: 12.4 x 6.9 x 4.7 cm = volume: 211 mL .
Echogenicity within normal limits. No mass or hydronephrosis
visualized.

Left Kidney:

Renal measurements: 12.9 x 6.1 x 5.7 cm = volume: 234 mL.
Echogenicity within normal limits. No mass or hydronephrosis
visualized.

Bladder:

Appears normal for degree of bladder distention.
IMPRESSION: 1. The kidneys are normal in appearance.
2. Splenomegaly. The spleen measures 13.1 x 14.8 x 5.8 cm with a
volume of 583 cc
3. Increased echogenicity in the liver, likely hepatic steatosis.

## 2021-01-02 ENCOUNTER — Ambulatory Visit: Payer: 59 | Admitting: Family Medicine
